# Patient Record
Sex: Male | Born: 1960
Health system: Southern US, Community
[De-identification: ages and names within clinical notes are randomized; demographics above are authoritative.]

## PROBLEM LIST (undated history)

## (undated) DIAGNOSIS — E119 Type 2 diabetes mellitus without complications: Secondary | ICD-10-CM

## (undated) DIAGNOSIS — I10 Essential (primary) hypertension: Secondary | ICD-10-CM

## (undated) DIAGNOSIS — E785 Hyperlipidemia, unspecified: Secondary | ICD-10-CM

## (undated) HISTORY — PX: NO PAST SURGERIES: SHX2092

## (undated) HISTORY — PX: COLONOSCOPY: SHX174

## (undated) HISTORY — DX: Hyperlipidemia, unspecified: E78.5

## (undated) HISTORY — DX: Essential (primary) hypertension: I10

---

## 2010-01-01 ENCOUNTER — Emergency Department (HOSPITAL_COMMUNITY): Admission: EM | Admit: 2010-01-01 | Discharge: 2010-01-01 | Payer: Self-pay | Admitting: Emergency Medicine

## 2013-07-30 ENCOUNTER — Ambulatory Visit: Payer: Self-pay | Admitting: Physician Assistant

## 2013-09-01 ENCOUNTER — Ambulatory Visit (INDEPENDENT_AMBULATORY_CARE_PROVIDER_SITE_OTHER): Payer: 59 | Admitting: Physician Assistant

## 2013-09-01 ENCOUNTER — Encounter: Payer: Self-pay | Admitting: Physician Assistant

## 2013-09-01 VITALS — BP 162/102 | HR 86 | Temp 97.6°F | Ht 72.0 in | Wt 279.8 lb

## 2013-09-01 DIAGNOSIS — L309 Dermatitis, unspecified: Secondary | ICD-10-CM | POA: Insufficient documentation

## 2013-09-01 DIAGNOSIS — Z1211 Encounter for screening for malignant neoplasm of colon: Secondary | ICD-10-CM

## 2013-09-01 DIAGNOSIS — L259 Unspecified contact dermatitis, unspecified cause: Secondary | ICD-10-CM

## 2013-09-01 DIAGNOSIS — I10 Essential (primary) hypertension: Secondary | ICD-10-CM

## 2013-09-01 DIAGNOSIS — R9431 Abnormal electrocardiogram [ECG] [EKG]: Secondary | ICD-10-CM

## 2013-09-01 DIAGNOSIS — Z0000 Encounter for general adult medical examination without abnormal findings: Secondary | ICD-10-CM

## 2013-09-01 MED ORDER — AMLODIPINE BESYLATE 10 MG PO TABS: 10.0000 mg | ORAL_TABLET | Freq: Every day | ORAL | Status: DC

## 2013-09-01 MED ORDER — HYDROCHLOROTHIAZIDE 12.5 MG PO CAPS: 12.5000 mg | ORAL_CAPSULE | Freq: Every day | ORAL | Status: DC

## 2013-09-01 MED ORDER — TRIAMCINOLONE ACETONIDE 0.1 % EX CREA: 1.0000 "application " | TOPICAL_CREAM | Freq: Two times a day (BID) | CUTANEOUS | Status: DC

## 2013-09-01 NOTE — Progress Notes (Signed)
   Patient ID: Dale Bishop is a 53 y.o. male DOB: [phone] MRN: [id number]     HPI:  Patient is a 53 year old male who presents to the office to establish care. Patient relocated to this area four years ago, has not been seen by a PCP since. Reports he has had HTN for nearly ten years without medications in the last five years. Took himself off of his medications. Was seen by cardiologist in past for an angioplasty exact year unknown. Has not seen cardiologist in over six years.  Also reports history of TIA's, states knows when they happen, only last for seconds, has not happened in some time. Has occasional outbreaks of eczema.  Reports no other chronic medical conditions. Denies chest pain/palpitations, SOB, cough, N/V/F, change in bowel/bladder habits, visual change/disturbances, headaches, lightheaded, dizzy or weakness. Denies nocturia, hematuria, difficulty urinating, pain/difficultly swallowing.   Influenza: 10/14 Tetanus: 2014 Eye Dr. 2/15 Dentist is scheduling appointment Colonoscopy: never  ROS: As stated in HPI. All other systems negative  Past Medical History  Diagnosis Date  . Hypertension    History reviewed. No pertinent family history. History   Social History  . Marital Status: Legally Separated    Spouse Name: N/A    Number of Children: N/A  . Years of Education: N/A   Social History Main Topics  . Smoking status: Never Smoker   . Smokeless tobacco: None  . Alcohol Use: No  . Drug Use: No  . Sexual Activity: None   Other Topics Concern  . None   Social History Narrative  . None   History reviewed. No pertinent past surgical history. No current outpatient prescriptions on file prior to visit.   No current facility-administered medications on file prior to visit.   Allergies  Allergen Reactions  . Penicillins     PE:  Filed Vitals:   09/01/13 0908  BP: 162/102  Pulse: 86  Temp: 97.6 F (36.4 C)    CONSTITUTIONAL: Well developed, well  nourished, pleasant, appears stated age, in NAD HEENT: normocephalic, atraumatic, bilateral ext/int canals normal. Bilateral TM's without injections, bulging, erythema. Nose normal, uvula midline, oropharynx clear and moist. EYES: PERRLA, bilateral EOM and conjunctiva normal NECK: FROM, supple, without thyromegaly or mass, no carotid bruits noted. CARDIO: RRR, normal S1 and S2, distal pulses intact, no extremity edema PULM/CHEST CTA bilateral, no wheezes, rales or rhonchi. Non tender. ABD: appearance normal, soft, nontender. Normal bowel sounds x 4 quadrants, non palpable liver, kidney, spleen. GU: deferred.  MUSC: FROM U/LE bilateral, FROM thoracic and lumbar spine, no midline tenderness noted.  LYMPH: no cervical, supraclavicular adenopathy NEURO: alert and oriented x 3, no cranial nerve deficit, motor strength and coordination NL.  DTR's intact, Negative romberg. Gait normal. SKIN: warm, dry, no lesions noted. eczematic rash to flexor surfaces of bilateral UE PSYCH: Mood and affect normal, speech normal.  ECG today, sinus rhythm, left atrial enlargement. Rate 81 bpm.   ASSESSMENT and PLAN   CPX/v70.0 - Patient has been counseled on age-appropriate routine health concerns for screening and prevention. These are reviewed and up-to-date. Immunizations are up-to-date or declined. Labs ordered and ECG reviewed.  HM Referral to GI for colonscopy  HTN: Rx for  Amlodipine 10 mg once daily HCTZ 12.5 mg once daily ECG today - abnormal, reading above Order for echo today RTO in one month for evaluation  Eczema Rx for Triamcinolone cream

## 2013-09-01 NOTE — Assessment & Plan Note (Signed)
Amlodipine 10 mg once daily HCTZ 12.5 mg once daily ECG today - abnormal, reading above Order for echo today RTO in one month for evaluation

## 2013-09-01 NOTE — Progress Notes (Signed)
Pre visit review using our clinic review tool, if applicable. No additional management support is needed unless otherwise documented below in the visit note. 

## 2013-09-01 NOTE — Patient Instructions (Signed)
It was great meeting you today Dale Bishop!   Labs have been ordered for you, when you report to lab please be fasting.    Hypertension Hypertension is another name for high blood pressure. High blood pressure may mean that your heart needs to work harder to pump blood. Blood pressure consists of two numbers, which includes a higher number over a lower number (example: 110/72). HOME CARE   Make lifestyle changes as told by your doctor. This may include weight loss and exercise.  Take your blood pressure medicine every day.  Limit how much salt you use.  Stop smoking if you smoke.  Do not use drugs.  Talk to your doctor if you are using decongestants or birth control pills. These medicines might make blood pressure higher.  Females should not drink more than 1 alcoholic drink per day. Males should not drink more than 2 alcoholic drinks per day.  See your doctor as told. GET HELP RIGHT AWAY IF:   You have a blood pressure reading with a top number of 180 or higher.  You get a very bad headache.  You get blurred or changing vision.  You feel confused.  You feel weak, numb, or faint.  You get chest or belly (abdominal) pain.  You throw up (vomit).  You cannot breathe very well. MAKE SURE YOU:   Understand these instructions.  Will watch your condition.  Will get help right away if you are not doing well or get worse. Document Released: 11/20/2007 Document Revised: 08/26/2011 Document Reviewed: 11/20/2007 [facility] Patient Information 2014 [place], [place].   DASH Diet The DASH diet stands for "Dietary Approaches to Stop Hypertension." It is a healthy eating plan that has been shown to reduce high blood pressure (hypertension) in as little as 14 days, while also possibly providing other significant health benefits. These other health benefits include reducing the risk of breast cancer after menopause and reducing the risk of type 2 diabetes, heart disease, colon  cancer, and stroke. Health benefits also include weight loss and slowing kidney failure in patients with chronic kidney disease.  DIET GUIDELINES  Limit salt (sodium). Your diet should contain less than 1500 mg of sodium daily.  Limit refined or processed carbohydrates. Your diet should include mostly whole grains. Desserts and added sugars should be used sparingly.  Include small amounts of heart-healthy fats. These types of fats include nuts, oils, and tub margarine. Limit saturated and trans fats. These fats have been shown to be harmful in the body. CHOOSING FOODS  The following food groups are based on a 2000 calorie diet. See your Registered Dietitian for individual calorie needs. Grains and Grain Products (6 to 8 servings daily)  Eat More Often: Whole-wheat bread, brown rice, whole-grain or wheat pasta, quinoa, popcorn without added fat or salt (air popped).  Eat Less Often: White bread, white pasta, white rice, cornbread. Vegetables (4 to 5 servings daily)  Eat More Often: Fresh, frozen, and canned vegetables. Vegetables may be raw, steamed, roasted, or grilled with a minimal amount of fat.  Eat Less Often/Avoid: Creamed or fried vegetables. Vegetables in a cheese sauce. Fruit (4 to 5 servings daily)  Eat More Often: All fresh, canned (in natural juice), or frozen fruits. Dried fruits without added sugar. One hundred percent fruit juice ( cup [237 mL] daily).  Eat Less Often: Dried fruits with added sugar. Canned fruit in light or heavy syrup. [employer], Fish, and Poultry (2 servings or less daily. One serving is 3  to 4 oz [85-114 g]).  Eat More Often: Ninety percent or leaner ground beef, tenderloin, sirloin. Round cuts of beef, chicken breast, [place] breast. All fish. Grill, bake, or broil your meat. Nothing should be fried.  Eat Less Often/Avoid: Fatty cuts of meat, [place], or chicken leg, thigh, or wing. Fried cuts of meat or fish. Dairy (2 to 3 servings)  Eat More  Often: Low-fat or fat-free milk, low-fat plain or light yogurt, reduced-fat or part-skim cheese.  Eat Less Often/Avoid: Milk (whole, 2%).Whole milk yogurt. Full-fat cheeses. Nuts, Seeds, and Legumes (4 to 5 servings per week)  Eat More Often: All without added salt.  Eat Less Often/Avoid: Salted nuts and seeds, canned beans with added salt. Fats and Sweets (limited)  Eat More Often: Vegetable oils, tub margarines without trans fats, sugar-free gelatin. Mayonnaise and salad dressings.  Eat Less Often/Avoid: Coconut oils, palm oils, butter, stick margarine, cream, half and half, cookies, candy, pie. FOR MORE INFORMATION The Dash Diet Eating Plan: www.dashdiet.org Document Released: 05/23/2011 Document Revised: 08/26/2011 Document Reviewed: 05/23/2011 [facility] Patient Information 2014 [place], [place].

## 2013-09-07 ENCOUNTER — Other Ambulatory Visit (INDEPENDENT_AMBULATORY_CARE_PROVIDER_SITE_OTHER): Payer: 59

## 2013-09-07 ENCOUNTER — Encounter: Payer: Self-pay | Admitting: Gastroenterology

## 2013-09-07 DIAGNOSIS — R9431 Abnormal electrocardiogram [ECG] [EKG]: Secondary | ICD-10-CM

## 2013-09-07 DIAGNOSIS — Z0000 Encounter for general adult medical examination without abnormal findings: Secondary | ICD-10-CM

## 2013-09-07 DIAGNOSIS — I10 Essential (primary) hypertension: Secondary | ICD-10-CM

## 2013-09-07 DIAGNOSIS — Z1211 Encounter for screening for malignant neoplasm of colon: Secondary | ICD-10-CM

## 2013-09-07 LAB — CBC WITH DIFFERENTIAL/PLATELET
Basophils Absolute: 0 10*3/uL (ref 0.0–0.1)
Basophils Relative: 0.5 % (ref 0.0–3.0)
EOS ABS: 0.2 10*3/uL (ref 0.0–0.7)
EOS PCT: 2.5 % (ref 0.0–5.0)
HCT: 46.3 % (ref 39.0–52.0)
HEMOGLOBIN: 15.2 g/dL (ref 13.0–17.0)
LYMPHS PCT: 31.5 % (ref 12.0–46.0)
Lymphs Abs: 2.4 10*3/uL (ref 0.7–4.0)
MCHC: 32.9 g/dL (ref 30.0–36.0)
MCV: 82.5 fl (ref 78.0–100.0)
Monocytes Absolute: 0.8 10*3/uL (ref 0.1–1.0)
Monocytes Relative: 10.7 % (ref 3.0–12.0)
NEUTROS PCT: 54.8 % (ref 43.0–77.0)
Neutro Abs: 4.2 10*3/uL (ref 1.4–7.7)
Platelets: 297 10*3/uL (ref 150.0–400.0)
RBC: 5.61 Mil/uL (ref 4.22–5.81)
RDW: 14.9 % — ABNORMAL HIGH (ref 11.5–14.6)
WBC: 7.7 10*3/uL (ref 4.5–10.5)

## 2013-09-07 LAB — URINALYSIS, ROUTINE W REFLEX MICROSCOPIC
Bilirubin Urine: NEGATIVE
Ketones, ur: NEGATIVE
LEUKOCYTES UA: NEGATIVE
Nitrite: NEGATIVE
PH: 6 (ref 5.0–8.0)
Specific Gravity, Urine: 1.02 (ref 1.000–1.030)
Total Protein, Urine: NEGATIVE
URINE GLUCOSE: NEGATIVE
UROBILINOGEN UA: 0.2 (ref 0.0–1.0)

## 2013-09-07 LAB — BASIC METABOLIC PANEL
BUN: 17 mg/dL (ref 6–23)
CHLORIDE: 101 meq/L (ref 96–112)
CO2: 27 meq/L (ref 19–32)
Calcium: 9.9 mg/dL (ref 8.4–10.5)
Creatinine, Ser: 1.4 mg/dL (ref 0.4–1.5)
GFR: 70.31 mL/min (ref >60.00)
GLUCOSE: 125 mg/dL — AB (ref 70–99)
Potassium: 3.6 mEq/L (ref 3.5–5.1)
SODIUM: 136 meq/L (ref 135–145)

## 2013-09-07 LAB — LIPID PANEL
CHOL/HDL RATIO: 6
Cholesterol: 267 mg/dL — ABNORMAL HIGH (ref 0–200)
HDL: 48.4 mg/dL (ref >39.00)
LDL Cholesterol: 185 mg/dL — ABNORMAL HIGH (ref 0–99)
Triglycerides: 167 mg/dL — ABNORMAL HIGH (ref 0.0–149.0)
VLDL: 33.4 mg/dL (ref 0.0–40.0)

## 2013-09-07 LAB — TSH: TSH: 2.71 u[IU]/mL (ref 0.35–5.50)

## 2013-09-07 LAB — HEPATIC FUNCTION PANEL
ALT: 22 U/L (ref 0–53)
AST: 18 U/L (ref 0–37)
Albumin: 4.4 g/dL (ref 3.5–5.2)
Alkaline Phosphatase: 50 U/L (ref 39–117)
BILIRUBIN TOTAL: 0.7 mg/dL (ref 0.3–1.2)
Bilirubin, Direct: 0.1 mg/dL (ref 0.0–0.3)
Total Protein: 8 g/dL (ref 6.0–8.3)

## 2013-09-08 ENCOUNTER — Other Ambulatory Visit: Payer: Self-pay | Admitting: Physician Assistant

## 2013-09-08 DIAGNOSIS — E785 Hyperlipidemia, unspecified: Secondary | ICD-10-CM

## 2013-09-08 MED ORDER — ATORVASTATIN CALCIUM 40 MG PO TABS: 40.0000 mg | ORAL_TABLET | Freq: Every day | ORAL | Status: DC

## 2013-09-16 ENCOUNTER — Other Ambulatory Visit (HOSPITAL_COMMUNITY): Payer: 59

## 2013-09-28 ENCOUNTER — Other Ambulatory Visit (HOSPITAL_COMMUNITY): Payer: 59

## 2013-09-30 ENCOUNTER — Ambulatory Visit: Payer: 59 | Admitting: Physician Assistant

## 2013-10-20 ENCOUNTER — Encounter: Payer: 59 | Admitting: Gastroenterology

## 2013-10-22 ENCOUNTER — Ambulatory Visit (INDEPENDENT_AMBULATORY_CARE_PROVIDER_SITE_OTHER): Payer: 59 | Admitting: Internal Medicine

## 2013-10-22 ENCOUNTER — Encounter: Payer: Self-pay | Admitting: Internal Medicine

## 2013-10-22 VITALS — BP 150/90 | HR 76 | Temp 97.1°F | Resp 16 | Wt 272.0 lb

## 2013-10-22 DIAGNOSIS — E785 Hyperlipidemia, unspecified: Secondary | ICD-10-CM

## 2013-10-22 DIAGNOSIS — L309 Dermatitis, unspecified: Secondary | ICD-10-CM

## 2013-10-22 DIAGNOSIS — L259 Unspecified contact dermatitis, unspecified cause: Secondary | ICD-10-CM

## 2013-10-22 DIAGNOSIS — I10 Essential (primary) hypertension: Secondary | ICD-10-CM

## 2013-10-22 MED ORDER — VITAMIN D 1000 UNITS PO TABS: 1000.0000 [IU] | ORAL_TABLET | Freq: Every day | ORAL | Status: AC

## 2013-10-22 MED ORDER — HYDROCHLOROTHIAZIDE 25 MG PO TABS: 25.0000 mg | ORAL_TABLET | Freq: Every day | ORAL | Status: DC

## 2013-10-22 NOTE — Patient Instructions (Signed)
Gluten free trial (no wheat products) for 4-6 weeks. OK to use gluten-free bread and gluten-free pasta.  Milk free trial (no milk, ice cream, cheese and yogurt) for 4-6 weeks. OK to use almond, coconut, rice milk. "Almond breeze" brand tastes good.  

## 2013-10-22 NOTE — Progress Notes (Signed)
Pre visit review using our clinic review tool, if applicable. No additional management support is needed unless otherwise documented below in the visit note. 

## 2013-10-22 NOTE — Progress Notes (Signed)
   Subjective:     HPI   F/u HTN, eczema, dyslipidemia  BP Readings from Last 3 Encounters:  10/22/13 150/90  09/01/13 162/102   Wt Readings from Last 3 Encounters:  10/22/13 272 lb (123.378 kg)  09/01/13 279 lb 12.8 oz (126.916 kg)       Review of Systems  Constitutional: Negative for appetite change, fatigue and unexpected weight change.  HENT: Negative for congestion, nosebleeds, sneezing, sore throat and trouble swallowing.   Eyes: Negative for itching and visual disturbance.  Respiratory: Negative for cough.   Cardiovascular: Negative for chest pain, palpitations and leg swelling.  Gastrointestinal: Negative for nausea, diarrhea, blood in stool and abdominal distention.  Genitourinary: Negative for frequency and hematuria.  Musculoskeletal: Negative for back pain, gait problem, joint swelling and neck pain.  Skin: Positive for rash.  Neurological: Negative for dizziness, tremors, speech difficulty and weakness.  Psychiatric/Behavioral: Negative for sleep disturbance, dysphoric mood and agitation. The patient is not nervous/anxious.        Objective:   Physical Exam  Constitutional: He is oriented to person, place, and time. He appears well-developed. No distress.  NAD  HENT:  Mouth/Throat: Oropharynx is clear and moist.  Eyes: Conjunctivae are normal. Pupils are equal, round, and reactive to light.  Neck: Normal range of motion. No JVD present. No thyromegaly present.  Cardiovascular: Normal rate, regular rhythm, normal heart sounds and intact distal pulses.  Exam reveals no gallop and no friction rub.   No murmur heard. Pulmonary/Chest: Effort normal and breath sounds normal. No respiratory distress. He has no wheezes. He has no rales. He exhibits no tenderness.  Abdominal: Soft. Bowel sounds are normal. He exhibits no distension and no mass. There is no tenderness. There is no rebound and no guarding.  Musculoskeletal: Normal range of motion. He exhibits no  edema and no tenderness.  Lymphadenopathy:    He has no cervical adenopathy.  Neurological: He is alert and oriented to person, place, and time. He has normal reflexes. No cranial nerve deficit. He exhibits normal muscle tone. He displays a negative Romberg sign. Coordination and gait normal.  No meningeal signs  Skin: Skin is warm and dry. No rash noted.  Psychiatric: He has a normal mood and affect. His behavior is normal. Judgment and thought content normal.    Lab Results  Component Value Date   WBC 7.7 09/07/2013   HGB 15.2 09/07/2013   HCT 46.3 09/07/2013   PLT 297.0 09/07/2013   GLUCOSE 125* 09/07/2013   CHOL 267* 09/07/2013   TRIG 167.0* 09/07/2013   HDL 48.40 09/07/2013   LDLCALC 185* 09/07/2013   ALT 22 09/07/2013   AST 18 09/07/2013   NA 136 09/07/2013   K 3.6 09/07/2013   CL 101 09/07/2013   CREATININE 1.4 09/07/2013   BUN 17 09/07/2013   CO2 27 09/07/2013   TSH 2.71 09/07/2013         Assessment & Plan:

## 2013-10-22 NOTE — Assessment & Plan Note (Signed)
On Lipitor 

## 2013-10-24 NOTE — Assessment & Plan Note (Addendum)
Will watch BP BP Readings from Last 3 Encounters:  10/22/13 150/90  09/01/13 162/102

## 2013-10-24 NOTE — Assessment & Plan Note (Signed)
Diet restriction Claritin Steroid cream

## 2014-02-23 ENCOUNTER — Ambulatory Visit: Payer: 59 | Admitting: Internal Medicine

## 2014-02-24 ENCOUNTER — Ambulatory Visit: Payer: 59 | Admitting: Internal Medicine

## 2014-02-24 ENCOUNTER — Telehealth: Payer: Self-pay | Admitting: *Deleted

## 2014-02-24 DIAGNOSIS — I10 Essential (primary) hypertension: Secondary | ICD-10-CM

## 2014-02-24 DIAGNOSIS — Z0000 Encounter for general adult medical examination without abnormal findings: Secondary | ICD-10-CM

## 2014-02-24 DIAGNOSIS — R9431 Abnormal electrocardiogram [ECG] [EKG]: Secondary | ICD-10-CM

## 2014-02-24 DIAGNOSIS — Z1211 Encounter for screening for malignant neoplasm of colon: Secondary | ICD-10-CM

## 2014-02-24 MED ORDER — AMLODIPINE BESYLATE 10 MG PO TABS: 10.0000 mg | ORAL_TABLET | Freq: Every day | ORAL | Status: DC

## 2014-02-24 NOTE — Telephone Encounter (Signed)
Pt states he is out of his amlodipine. Have appt to see md 02/28/14 for f/u he maybe changing med requesting a 5 day supply until he see md. Inform pt will send...Johny Chess

## 2014-02-28 ENCOUNTER — Encounter: Payer: Self-pay | Admitting: Internal Medicine

## 2014-02-28 ENCOUNTER — Ambulatory Visit (INDEPENDENT_AMBULATORY_CARE_PROVIDER_SITE_OTHER): Payer: 59 | Admitting: Internal Medicine

## 2014-02-28 ENCOUNTER — Other Ambulatory Visit: Payer: Self-pay | Admitting: Internal Medicine

## 2014-02-28 VITALS — BP 140/90 | HR 70 | Temp 97.7°F | Wt 265.0 lb

## 2014-02-28 DIAGNOSIS — L309 Dermatitis, unspecified: Secondary | ICD-10-CM

## 2014-02-28 DIAGNOSIS — I10 Essential (primary) hypertension: Secondary | ICD-10-CM

## 2014-02-28 DIAGNOSIS — L259 Unspecified contact dermatitis, unspecified cause: Secondary | ICD-10-CM

## 2014-02-28 DIAGNOSIS — D485 Neoplasm of uncertain behavior of skin: Secondary | ICD-10-CM | POA: Insufficient documentation

## 2014-02-28 DIAGNOSIS — E785 Hyperlipidemia, unspecified: Secondary | ICD-10-CM

## 2014-02-28 MED ORDER — TRIAMTERENE-HCTZ 37.5-25 MG PO TABS: 1.0000 | ORAL_TABLET | Freq: Every day | ORAL | Status: DC

## 2014-02-28 NOTE — Assessment & Plan Note (Signed)
9/5 L inner thigh Skin bx

## 2014-02-28 NOTE — Progress Notes (Signed)
Pre visit review using our clinic review tool, if applicable. No additional management support is needed unless otherwise documented below in the visit note. 

## 2014-02-28 NOTE — Patient Instructions (Signed)
Gluten free trial (no wheat products) for 4-6 weeks. OK to use gluten-free bread and gluten-free pasta.  Milk free trial (no milk, ice cream, cheese and yogurt) for 4-6 weeks. OK to use almond, coconut, milk. "Almond breeze" brand tastes good.

## 2014-02-28 NOTE — Progress Notes (Signed)
Patient ID: Dale Bishop, male   DOB: [birth year], 53 y.o.   MRN: [id number]   Subjective:     HPI   F/u HTN, eczema, dyslipidemia  BP Readings from Last 3 Encounters:  02/28/14 140/90  10/22/13 150/90  09/01/13 162/102   Wt Readings from Last 3 Encounters:  02/28/14 265 lb (120.203 kg)  10/22/13 272 lb (123.378 kg)  09/01/13 279 lb 12.8 oz (126.916 kg)       Review of Systems  Constitutional: Negative for appetite change, fatigue and unexpected weight change.  HENT: Negative for congestion, nosebleeds, sneezing, sore throat and trouble swallowing.   Eyes: Negative for itching and visual disturbance.  Respiratory: Negative for cough.   Cardiovascular: Negative for chest pain, palpitations and leg swelling.  Gastrointestinal: Negative for nausea, diarrhea, blood in stool and abdominal distention.  Genitourinary: Negative for frequency and hematuria.  Musculoskeletal: Negative for back pain, gait problem, joint swelling and neck pain.  Skin: Positive for rash.  Neurological: Negative for dizziness, tremors, speech difficulty and weakness.  Psychiatric/Behavioral: Negative for sleep disturbance, dysphoric mood and agitation. The patient is not nervous/anxious.        Objective:   Physical Exam  Constitutional: He is oriented to person, place, and time. He appears well-developed. No distress.  NAD  HENT:  Mouth/Throat: Oropharynx is clear and moist.  Eyes: Conjunctivae are normal. Pupils are equal, round, and reactive to light.  Neck: Normal range of motion. No JVD present. No thyromegaly present.  Cardiovascular: Normal rate, regular rhythm, normal heart sounds and intact distal pulses.  Exam reveals no gallop and no friction rub.   No murmur heard. Pulmonary/Chest: Effort normal and breath sounds normal. No respiratory distress. He has no wheezes. He has no rales. He exhibits no tenderness.  Abdominal: Soft. Bowel sounds are normal. He exhibits no distension and no  mass. There is no tenderness. There is no rebound and no guarding.  Musculoskeletal: Normal range of motion. He exhibits no edema and no tenderness.  Lymphadenopathy:    He has no cervical adenopathy.  Neurological: He is alert and oriented to person, place, and time. He has normal reflexes. No cranial nerve deficit. He exhibits normal muscle tone. He displays a negative Romberg sign. Coordination and gait normal.  No meningeal signs  Skin: Skin is warm and dry. No rash noted.  Psychiatric: He has a normal mood and affect. His behavior is normal. Judgment and thought content normal.  L inner thigh growth  Lab Results  Component Value Date   WBC 7.7 09/07/2013   HGB 15.2 09/07/2013   HCT 46.3 09/07/2013   PLT 297.0 09/07/2013   GLUCOSE 125* 09/07/2013   CHOL 267* 09/07/2013   TRIG 167.0* 09/07/2013   HDL 48.40 09/07/2013   LDLCALC 185* 09/07/2013   ALT 22 09/07/2013   AST 18 09/07/2013   NA 136 09/07/2013   K 3.6 09/07/2013   CL 101 09/07/2013   CREATININE 1.4 09/07/2013   BUN 17 09/07/2013   CO2 27 09/07/2013   TSH 2.71 09/07/2013         Assessment & Plan:

## 2014-03-03 ENCOUNTER — Ambulatory Visit: Payer: 59 | Admitting: Internal Medicine

## 2014-03-03 ENCOUNTER — Encounter: Payer: Self-pay | Admitting: Internal Medicine

## 2014-03-03 ENCOUNTER — Telehealth: Payer: Self-pay | Admitting: *Deleted

## 2014-03-03 DIAGNOSIS — Z1211 Encounter for screening for malignant neoplasm of colon: Secondary | ICD-10-CM

## 2014-03-03 DIAGNOSIS — I10 Essential (primary) hypertension: Secondary | ICD-10-CM

## 2014-03-03 DIAGNOSIS — R9431 Abnormal electrocardiogram [ECG] [EKG]: Secondary | ICD-10-CM

## 2014-03-03 DIAGNOSIS — Z0000 Encounter for general adult medical examination without abnormal findings: Secondary | ICD-10-CM

## 2014-03-03 MED ORDER — AMLODIPINE BESYLATE 10 MG PO TABS: 10.0000 mg | ORAL_TABLET | Freq: Every day | ORAL | Status: DC

## 2014-03-03 NOTE — Assessment & Plan Note (Signed)
Continue with current prescription therapy as reflected on the Med list.  

## 2014-03-03 NOTE — Telephone Encounter (Signed)
Left msg on triage needing his norvasc sent to [place]. He is out of med.../lmb   Notified pt will send to [place]...Johny Chess

## 2014-03-03 NOTE — Assessment & Plan Note (Signed)
Better Continue with current prescription therapy as reflected on the Med list.  

## 2014-03-09 ENCOUNTER — Ambulatory Visit: Payer: 59 | Admitting: Internal Medicine

## 2014-03-29 ENCOUNTER — Ambulatory Visit: Payer: 59 | Admitting: Internal Medicine

## 2014-04-01 ENCOUNTER — Ambulatory Visit: Payer: 59 | Admitting: Internal Medicine

## 2014-04-19 ENCOUNTER — Ambulatory Visit (INDEPENDENT_AMBULATORY_CARE_PROVIDER_SITE_OTHER): Payer: 59 | Admitting: Internal Medicine

## 2014-04-19 ENCOUNTER — Encounter: Payer: Self-pay | Admitting: Internal Medicine

## 2014-04-19 VITALS — BP 152/100 | HR 84 | Temp 97.8°F | Wt 267.0 lb

## 2014-04-19 DIAGNOSIS — D489 Neoplasm of uncertain behavior, unspecified: Secondary | ICD-10-CM

## 2014-04-19 DIAGNOSIS — R110 Nausea: Secondary | ICD-10-CM

## 2014-04-19 DIAGNOSIS — J309 Allergic rhinitis, unspecified: Secondary | ICD-10-CM

## 2014-04-19 DIAGNOSIS — I10 Essential (primary) hypertension: Secondary | ICD-10-CM

## 2014-04-19 DIAGNOSIS — D485 Neoplasm of uncertain behavior of skin: Secondary | ICD-10-CM

## 2014-04-19 MED ORDER — CARVEDILOL 12.5 MG PO TABS: 12.5000 mg | ORAL_TABLET | Freq: Two times a day (BID) | ORAL | Status: DC

## 2014-04-19 MED ORDER — FLUTICASONE PROPIONATE 50 MCG/ACT NA SUSP: 2.0000 | Freq: Every day | NASAL | Status: DC

## 2014-04-19 MED ORDER — LORATADINE 10 MG PO TABS: 10.0000 mg | ORAL_TABLET | Freq: Every day | ORAL | Status: DC

## 2014-04-19 NOTE — Patient Instructions (Signed)
Postprocedure instructions :    A Band-Aid should be  changed twice daily. You can take a shower tomorrow.  Keep the wounds clean. You can wash them with liquid soap and water. Pat dry with gauze or a Kleenex tissue  Before applying antibiotic ointment and a Band-Aid.   You need to report immediately  if fever, chills or any signs of infection develop.    The biopsy results should be available in 1 -2 weeks. 

## 2014-04-19 NOTE — Progress Notes (Signed)
Pre visit review using our clinic review tool, if applicable. No additional management support is needed unless otherwise documented below in the visit note. 

## 2014-04-19 NOTE — Progress Notes (Signed)
Subjective:     HPI  He is here for skin bx C/o nausea w/HCTZ C/o allergies F/u HTN, eczema, dyslipidemia  BP Readings from Last 3 Encounters:  04/19/14 152/100  02/28/14 140/90  10/22/13 150/90   Wt Readings from Last 3 Encounters:  04/19/14 267 lb (121.11 kg)  02/28/14 265 lb (120.203 kg)  10/22/13 272 lb (123.378 kg)       Review of Systems  Constitutional: Negative for appetite change, fatigue and unexpected weight change.  HENT: Negative for congestion, nosebleeds, sneezing, sore throat and trouble swallowing.   Eyes: Negative for itching and visual disturbance.  Respiratory: Negative for cough.   Cardiovascular: Negative for chest pain, palpitations and leg swelling.  Gastrointestinal: Negative for nausea, diarrhea, blood in stool and abdominal distention.  Genitourinary: Negative for frequency and hematuria.  Musculoskeletal: Negative for back pain, joint swelling, gait problem and neck pain.  Skin: Positive for rash.  Neurological: Negative for dizziness, tremors, speech difficulty and weakness.  Psychiatric/Behavioral: Negative for sleep disturbance, dysphoric mood and agitation. The patient is not nervous/anxious.        Objective:   Physical Exam  Constitutional: He is oriented to person, place, and time. He appears well-developed. No distress.  NAD  HENT:  Mouth/Throat: Oropharynx is clear and moist.  Eyes: Conjunctivae are normal. Pupils are equal, round, and reactive to light.  Neck: Normal range of motion. No JVD present. No thyromegaly present.  Cardiovascular: Normal rate, regular rhythm, normal heart sounds and intact distal pulses.  Exam reveals no gallop and no friction rub.   No murmur heard. Pulmonary/Chest: Effort normal and breath sounds normal. No respiratory distress. He has no wheezes. He has no rales. He exhibits no tenderness.  Abdominal: Soft. Bowel sounds are normal. He exhibits no distension and no mass. There is no tenderness.  There is no rebound and no guarding.  Musculoskeletal: Normal range of motion. He exhibits no edema or tenderness.  Lymphadenopathy:    He has no cervical adenopathy.  Neurological: He is alert and oriented to person, place, and time. He has normal reflexes. No cranial nerve deficit. He exhibits normal muscle tone. He displays a negative Romberg sign. Coordination and gait normal.  No meningeal signs  Skin: Skin is warm and dry. No rash noted.  Psychiatric: He has a normal mood and affect. His behavior is normal. Judgment and thought content normal.  L inner thigh growth  Lab Results  Component Value Date   WBC 7.7 09/07/2013   HGB 15.2 09/07/2013   HCT 46.3 09/07/2013   PLT 297.0 09/07/2013   GLUCOSE 125* 09/07/2013   CHOL 267* 09/07/2013   TRIG 167.0* 09/07/2013   HDL 48.40 09/07/2013   LDLCALC 185* 09/07/2013   ALT 22 09/07/2013   AST 18 09/07/2013   NA 136 09/07/2013   K 3.6 09/07/2013   CL 101 09/07/2013   CREATININE 1.4 09/07/2013   BUN 17 09/07/2013   CO2 27 09/07/2013   TSH 2.71 09/07/2013     Procedure Note :     Procedure :  Skin biopsy   Indication: Suspicious lesion(s)   Risks including unsuccessful procedure , bleeding, infection, bruising, scar, a need for another complete procedure and others were explained to the patient in detail as well as the benefits. Informed consent was obtained and signed.   The patient was placed in a decubitus position.  Lesion #1 on L thigh    measuring  6x6   mm   Skin over  lesion #1  was prepped with Betadine and alcohol  and anesthetized with 1 cc of 2% lidocaine and epinephrine, using a 25-gauge 1 inch needle.  Shave biopsy with a sterile Dermablade was carried out in the usual fashion. Hyfrecator was used to destroy the rest of the lesion potentially left behind and for hemostasis. Band-Aid was applied with antibiotic ointment.       Assessment & Plan:

## 2014-04-21 DIAGNOSIS — R110 Nausea: Secondary | ICD-10-CM | POA: Insufficient documentation

## 2014-04-21 DIAGNOSIS — J309 Allergic rhinitis, unspecified: Secondary | ICD-10-CM | POA: Insufficient documentation

## 2014-04-21 NOTE — Assessment & Plan Note (Signed)
See med change

## 2014-04-21 NOTE — Assessment & Plan Note (Signed)
2015 due to maxzide Will d/c

## 2014-04-21 NOTE — Assessment & Plan Note (Signed)
Skin bx 

## 2014-04-21 NOTE — Assessment & Plan Note (Signed)
- 

## 2014-08-01 ENCOUNTER — Encounter: Payer: Self-pay | Admitting: Internal Medicine

## 2014-08-01 ENCOUNTER — Ambulatory Visit (INDEPENDENT_AMBULATORY_CARE_PROVIDER_SITE_OTHER): Payer: 59 | Admitting: Internal Medicine

## 2014-08-01 ENCOUNTER — Telehealth: Payer: Self-pay | Admitting: Internal Medicine

## 2014-08-01 VITALS — BP 140/98 | HR 82 | Temp 97.7°F | Ht 71.5 in | Wt 277.0 lb

## 2014-08-01 DIAGNOSIS — R9431 Abnormal electrocardiogram [ECG] [EKG]: Secondary | ICD-10-CM

## 2014-08-01 DIAGNOSIS — J3089 Other allergic rhinitis: Secondary | ICD-10-CM

## 2014-08-01 DIAGNOSIS — Z0000 Encounter for general adult medical examination without abnormal findings: Secondary | ICD-10-CM

## 2014-08-01 DIAGNOSIS — E785 Hyperlipidemia, unspecified: Secondary | ICD-10-CM

## 2014-08-01 DIAGNOSIS — I10 Essential (primary) hypertension: Secondary | ICD-10-CM

## 2014-08-01 DIAGNOSIS — H6590 Unspecified nonsuppurative otitis media, unspecified ear: Secondary | ICD-10-CM

## 2014-08-01 DIAGNOSIS — Z1211 Encounter for screening for malignant neoplasm of colon: Secondary | ICD-10-CM

## 2014-08-01 MED ORDER — CARVEDILOL 12.5 MG PO TABS: 12.5000 mg | ORAL_TABLET | Freq: Two times a day (BID) | ORAL | Status: DC

## 2014-08-01 MED ORDER — LEVOFLOXACIN 500 MG PO TABS: 500.0000 mg | ORAL_TABLET | Freq: Every day | ORAL | Status: DC

## 2014-08-01 MED ORDER — HYDROCHLOROTHIAZIDE 25 MG PO TABS: 25.0000 mg | ORAL_TABLET | Freq: Every day | ORAL | Status: DC

## 2014-08-01 MED ORDER — AMLODIPINE BESYLATE 10 MG PO TABS: 10.0000 mg | ORAL_TABLET | Freq: Every day | ORAL | Status: DC

## 2014-08-01 NOTE — Assessment & Plan Note (Signed)
Continue with current prescription HCTZ and Norvasc Re-start Coreg

## 2014-08-01 NOTE — Assessment & Plan Note (Signed)
2/16 L>R    Several weeks Empiric Levaquin

## 2014-08-01 NOTE — Assessment & Plan Note (Signed)
Flonase

## 2014-08-01 NOTE — Assessment & Plan Note (Addendum)
We discussed age appropriate health related issues, including available/recomended screening tests and vaccinations. We discussed a need for adhering to healthy diet and exercise. Labs/EKG were reviewed/ordered. All questions were answered.  Declined a rectal exam  Colonoscopy

## 2014-08-01 NOTE — Telephone Encounter (Signed)
emmi emailed °

## 2014-08-01 NOTE — Assessment & Plan Note (Signed)
Taking Lipitor qod

## 2014-08-01 NOTE — Progress Notes (Signed)
   Subjective:     HPI  The patient is here for a wellness exam. The patient has been doing well overall without major physical or psychological issues going on lately. C/o L ear congestion and fullness (Nov 2015)   F/u HTN, eczema, dyslipidemia  BP Readings from Last 3 Encounters:  08/01/14 140/98  04/19/14 152/100  02/28/14 140/90   Wt Readings from Last 3 Encounters:  08/01/14 277 lb (125.646 kg)  04/19/14 267 lb (121.11 kg)  02/28/14 265 lb (120.203 kg)       Review of Systems  Constitutional: Negative for appetite change, fatigue and unexpected weight change.  HENT: Negative for congestion, nosebleeds, sneezing, sore throat and trouble swallowing.   Eyes: Negative for itching and visual disturbance.  Respiratory: Negative for cough.   Cardiovascular: Negative for chest pain, palpitations and leg swelling.  Gastrointestinal: Negative for nausea, diarrhea, blood in stool and abdominal distention.  Genitourinary: Negative for frequency and hematuria.  Musculoskeletal: Negative for back pain, joint swelling, gait problem and neck pain.  Skin: Positive for rash.  Neurological: Negative for dizziness, tremors, speech difficulty and weakness.  Psychiatric/Behavioral: Negative for sleep disturbance, dysphoric mood and agitation. The patient is not nervous/anxious.        Objective:   Physical Exam  Constitutional: He is oriented to person, place, and time. He appears well-developed. No distress.  NAD  HENT:  Mouth/Throat: Oropharynx is clear and moist.  Eyes: Conjunctivae are normal. Pupils are equal, round, and reactive to light.  Neck: Normal range of motion. No JVD present. No thyromegaly present.  Cardiovascular: Normal rate, regular rhythm, normal heart sounds and intact distal pulses.  Exam reveals no gallop and no friction rub.   No murmur heard. Pulmonary/Chest: Effort normal and breath sounds normal. No respiratory distress. He has no wheezes. He has no rales.  He exhibits no tenderness.  Abdominal: Soft. Bowel sounds are normal. He exhibits no distension and no mass. There is no tenderness. There is no rebound and no guarding.  Musculoskeletal: Normal range of motion. He exhibits no edema or tenderness.  Lymphadenopathy:    He has no cervical adenopathy.  Neurological: He is alert and oriented to person, place, and time. He has normal reflexes. No cranial nerve deficit. He exhibits normal muscle tone. He displays a negative Romberg sign. Coordination and gait normal.  No meningeal signs  Skin: Skin is warm and dry. No rash noted.  Psychiatric: He has a normal mood and affect. His behavior is normal. Judgment and thought content normal.  Declined a rectal exam  Lab Results  Component Value Date   WBC 7.7 09/07/2013   HGB 15.2 09/07/2013   HCT 46.3 09/07/2013   PLT 297.0 09/07/2013   GLUCOSE 125* 09/07/2013   CHOL 267* 09/07/2013   TRIG 167.0* 09/07/2013   HDL 48.40 09/07/2013   LDLCALC 185* 09/07/2013   ALT 22 09/07/2013   AST 18 09/07/2013   NA 136 09/07/2013   K 3.6 09/07/2013   CL 101 09/07/2013   CREATININE 1.4 09/07/2013   BUN 17 09/07/2013   CO2 27 09/07/2013   TSH 2.71 09/07/2013         Assessment & Plan:

## 2014-08-01 NOTE — Progress Notes (Signed)
Pre visit review using our clinic review tool, if applicable. No additional management support is needed unless otherwise documented below in the visit note. 

## 2014-09-01 ENCOUNTER — Encounter: Payer: Self-pay | Admitting: Internal Medicine

## 2014-09-14 ENCOUNTER — Other Ambulatory Visit: Payer: Self-pay | Admitting: Internal Medicine

## 2014-11-30 ENCOUNTER — Ambulatory Visit: Payer: 59 | Admitting: Internal Medicine

## 2015-01-06 ENCOUNTER — Ambulatory Visit (INDEPENDENT_AMBULATORY_CARE_PROVIDER_SITE_OTHER): Payer: 59 | Admitting: Internal Medicine

## 2015-01-06 ENCOUNTER — Encounter: Payer: Self-pay | Admitting: Internal Medicine

## 2015-01-06 VITALS — BP 130/80 | HR 72 | Temp 97.8°F | Wt 272.0 lb

## 2015-01-06 DIAGNOSIS — G5601 Carpal tunnel syndrome, right upper limb: Secondary | ICD-10-CM | POA: Diagnosis not present

## 2015-01-06 DIAGNOSIS — G5602 Carpal tunnel syndrome, left upper limb: Secondary | ICD-10-CM

## 2015-01-06 DIAGNOSIS — I10 Essential (primary) hypertension: Secondary | ICD-10-CM

## 2015-01-06 DIAGNOSIS — E785 Hyperlipidemia, unspecified: Secondary | ICD-10-CM | POA: Diagnosis not present

## 2015-01-06 DIAGNOSIS — G5603 Carpal tunnel syndrome, bilateral upper limbs: Secondary | ICD-10-CM

## 2015-01-06 MED ORDER — VITAMIN D 1000 UNITS PO TABS: 1000.0000 [IU] | ORAL_TABLET | Freq: Every day | ORAL | Status: DC

## 2015-01-06 MED ORDER — MELOXICAM 7.5 MG PO TABS: 7.5000 mg | ORAL_TABLET | Freq: Every day | ORAL | Status: DC

## 2015-01-06 NOTE — Progress Notes (Signed)
Pre visit review using our clinic review tool, if applicable. No additional management support is needed unless otherwise documented below in the visit note. 

## 2015-01-06 NOTE — Progress Notes (Signed)
Subjective:  Patient ID: Dale Bishop, male    DOB: Apr 30, 1961  Age: 54 y.o. MRN: [id number]  CC: No chief complaint on file.   HPI Dale Bishop presents for HTN, dyslipidemia. C/o R thumb pain  Outpatient Prescriptions Prior to Visit  Medication Sig Dispense Refill  . amLODipine (NORVASC) 10 MG tablet Take 1 tablet (10 mg total) by mouth daily. 90 tablet 3  . atorvastatin (LIPITOR) 40 MG tablet Take 1 tablet (40 mg total) by mouth daily. 90 tablet 1  . carvedilol (COREG) 12.5 MG tablet Take 1 tablet (12.5 mg total) by mouth 2 (two) times daily with a meal. 180 tablet 3  . hydrochlorothiazide (HYDRODIURIL) 25 MG tablet Take 1 tablet (25 mg total) by mouth daily. 90 tablet 3  . loratadine (CLARITIN) 10 MG tablet Take 1 tablet (10 mg total) by mouth daily. 100 tablet 3  . triamcinolone cream (KENALOG) 0.1 % Apply 1 application topically 2 (two) times daily. 30 g 3  . fluticasone (FLONASE) 50 MCG/ACT nasal spray Place 2 sprays into both nostrils daily. (Patient not taking: Reported on 01/06/2015) 16 g 6  . levofloxacin (LEVAQUIN) 500 MG tablet Take 1 tablet (500 mg total) by mouth daily. (Patient not taking: Reported on 01/06/2015) 10 tablet 0   No facility-administered medications prior to visit.    ROS Review of Systems  Constitutional: Negative for appetite change, fatigue and unexpected weight change.  HENT: Negative for congestion, nosebleeds, sneezing, sore throat and trouble swallowing.   Eyes: Negative for itching and visual disturbance.  Respiratory: Negative for cough.   Cardiovascular: Negative for chest pain, palpitations and leg swelling.  Gastrointestinal: Negative for nausea, diarrhea, blood in stool and abdominal distention.  Genitourinary: Negative for frequency and hematuria.  Musculoskeletal: Negative for back pain, joint swelling, gait problem and neck pain.  Skin: Negative for rash.  Neurological: Positive for numbness. Negative for dizziness, tremors, speech  difficulty and weakness.  Psychiatric/Behavioral: Negative for sleep disturbance, dysphoric mood and agitation. The patient is not nervous/anxious.     Objective:  BP 130/80 mmHg  Pulse 72  Temp(Src) 97.8 F (36.6 C)  Wt 272 lb (123.378 kg)  SpO2 97%  BP Readings from Last 3 Encounters:  01/06/15 130/80  08/01/14 140/98  04/19/14 152/100    Wt Readings from Last 3 Encounters:  01/06/15 272 lb (123.378 kg)  08/01/14 277 lb (125.646 kg)  04/19/14 267 lb (121.11 kg)    Physical Exam  Constitutional: He is oriented to person, place, and time. He appears well-developed. No distress.  NAD  HENT:  Mouth/Throat: Oropharynx is clear and moist.  Eyes: Conjunctivae are normal. Pupils are equal, round, and reactive to light.  Neck: Normal range of motion. No JVD present. No thyromegaly present.  Cardiovascular: Normal rate, regular rhythm, normal heart sounds and intact distal pulses.  Exam reveals no gallop and no friction rub.   No murmur heard. Pulmonary/Chest: Effort normal and breath sounds normal. No respiratory distress. He has no wheezes. He has no rales. He exhibits no tenderness.  Abdominal: Soft. Bowel sounds are normal. He exhibits no distension and no mass. There is no tenderness. There is no rebound and no guarding.  Musculoskeletal: Normal range of motion. He exhibits no edema or tenderness.  Lymphadenopathy:    He has no cervical adenopathy.  Neurological: He is alert and oriented to person, place, and time. He has normal reflexes. No cranial nerve deficit. He exhibits normal muscle tone. He displays a negative Romberg sign.  Coordination and gait normal.  Skin: Skin is warm and dry. No rash noted.  Psychiatric: He has a normal mood and affect. His behavior is normal. Judgment and thought content normal.   R thumb ganglion (+) CTS signs B  Lab Results  Component Value Date   WBC 7.7 09/07/2013   HGB 15.2 09/07/2013   HCT 46.3 09/07/2013   PLT 297.0 09/07/2013    GLUCOSE 125* 09/07/2013   CHOL 267* 09/07/2013   TRIG 167.0* 09/07/2013   HDL 48.40 09/07/2013   LDLCALC 185* 09/07/2013   ALT 22 09/07/2013   AST 18 09/07/2013   NA 136 09/07/2013   K 3.6 09/07/2013   CL 101 09/07/2013   CREATININE 1.4 09/07/2013   BUN 17 09/07/2013   CO2 27 09/07/2013   TSH 2.71 09/07/2013    No results found.  Assessment & Plan:     Meds ordered this encounter  Medications  . cholecalciferol (VITAMIN D) 1000 UNITS tablet    Sig: Take 1 tablet (1,000 Units total) by mouth daily.    Dispense:  100 tablet    Refill:  3  . meloxicam (MOBIC) 7.5 MG tablet    Sig: Take 1 tablet (7.5 mg total) by mouth daily.    Dispense:  30 tablet    Refill:  1     Follow-up: Return in about 6 months (around 07/09/2015) for Wellness Exam.  Walker Kehr, MD

## 2015-01-08 DIAGNOSIS — G5600 Carpal tunnel syndrome, unspecified upper limb: Secondary | ICD-10-CM | POA: Insufficient documentation

## 2015-01-08 NOTE — Assessment & Plan Note (Signed)
7/16 B hands - likely work related Pt needs ergonomic work station Meloxicam

## 2015-01-08 NOTE — Assessment & Plan Note (Signed)
On Amlodipine, Coreg 

## 2015-01-08 NOTE — Assessment & Plan Note (Signed)
5/15 on Lipitor

## 2015-02-21 ENCOUNTER — Other Ambulatory Visit: Payer: Self-pay | Admitting: Physician Assistant

## 2015-02-22 ENCOUNTER — Other Ambulatory Visit: Payer: Self-pay | Admitting: *Deleted

## 2015-02-22 MED ORDER — TRIAMCINOLONE ACETONIDE 0.1 % EX CREA: 1.0000 "application " | TOPICAL_CREAM | Freq: Two times a day (BID) | CUTANEOUS | Status: DC

## 2015-08-03 ENCOUNTER — Encounter: Payer: Self-pay | Admitting: Internal Medicine

## 2015-08-03 ENCOUNTER — Ambulatory Visit (INDEPENDENT_AMBULATORY_CARE_PROVIDER_SITE_OTHER): Payer: 59 | Admitting: Internal Medicine

## 2015-08-03 ENCOUNTER — Encounter: Payer: Self-pay | Admitting: Gastroenterology

## 2015-08-03 VITALS — BP 125/85 | HR 79 | Ht 72.0 in | Wt 273.0 lb

## 2015-08-03 DIAGNOSIS — G5603 Carpal tunnel syndrome, bilateral upper limbs: Secondary | ICD-10-CM

## 2015-08-03 DIAGNOSIS — Z0000 Encounter for general adult medical examination without abnormal findings: Secondary | ICD-10-CM | POA: Diagnosis not present

## 2015-08-03 DIAGNOSIS — R9431 Abnormal electrocardiogram [ECG] [EKG]: Secondary | ICD-10-CM

## 2015-08-03 DIAGNOSIS — Z1211 Encounter for screening for malignant neoplasm of colon: Secondary | ICD-10-CM | POA: Diagnosis not present

## 2015-08-03 DIAGNOSIS — I10 Essential (primary) hypertension: Secondary | ICD-10-CM

## 2015-08-03 DIAGNOSIS — D485 Neoplasm of uncertain behavior of skin: Secondary | ICD-10-CM

## 2015-08-03 DIAGNOSIS — E785 Hyperlipidemia, unspecified: Secondary | ICD-10-CM

## 2015-08-03 MED ORDER — HYDROCHLOROTHIAZIDE 25 MG PO TABS: 25.0000 mg | ORAL_TABLET | Freq: Every day | ORAL | Status: DC

## 2015-08-03 MED ORDER — AMLODIPINE BESYLATE 10 MG PO TABS
10.0000 mg | ORAL_TABLET | Freq: Every day | ORAL | Status: DC
Start: 2015-08-03 — End: 2016-09-30

## 2015-08-03 MED ORDER — CARVEDILOL 12.5 MG PO TABS: 12.5000 mg | ORAL_TABLET | Freq: Two times a day (BID) | ORAL | Status: DC

## 2015-08-03 MED FILL — CARVEDILOL 12.5 MG TABLET: 12.5 | 90 days supply | Qty: 180 | Fill #0

## 2015-08-03 NOTE — Assessment & Plan Note (Addendum)
2017 using R brace Labs

## 2015-08-03 NOTE — Assessment & Plan Note (Signed)
  On Amlodipine, Coreg 

## 2015-08-03 NOTE — Progress Notes (Signed)
Pre visit review using our clinic review tool, if applicable. No additional management support is needed unless otherwise documented below in the visit note. 

## 2015-08-03 NOTE — Progress Notes (Signed)
Subjective:  Patient ID: Dale Bishop, male    DOB: Oct 07, 1960  Age: 55 y.o. MRN: [id number]  CC: Annual Exam   HPI Dale Bishop presents for a well exam. F/u CTS, dyslipidemia  Outpatient Prescriptions Prior to Visit  Medication Sig Dispense Refill  . amLODipine (NORVASC) 10 MG tablet Take 1 tablet (10 mg total) by mouth daily. 90 tablet 3  . carvedilol (COREG) 12.5 MG tablet Take 1 tablet (12.5 mg total) by mouth 2 (two) times daily with a meal. 180 tablet 3  . cholecalciferol (VITAMIN D) 1000 UNITS tablet Take 1 tablet (1,000 Units total) by mouth daily. 100 tablet 3  . fluticasone (FLONASE) 50 MCG/ACT nasal spray Place 2 sprays into both nostrils daily. 16 g 6  . hydrochlorothiazide (HYDRODIURIL) 25 MG tablet Take 1 tablet (25 mg total) by mouth daily. 90 tablet 3  . loratadine (CLARITIN) 10 MG tablet Take 1 tablet (10 mg total) by mouth daily. 100 tablet 3  . meloxicam (MOBIC) 7.5 MG tablet Take 1 tablet (7.5 mg total) by mouth daily. 30 tablet 1  . atorvastatin (LIPITOR) 40 MG tablet Take 1 tablet (40 mg total) by mouth daily. (Patient not taking: Reported on 08/03/2015) 90 tablet 1  . triamcinolone cream (KENALOG) 0.1 % Apply 1 application topically 2 (two) times daily. (Patient not taking: Reported on 08/03/2015) 30 g 3   No facility-administered medications prior to visit.    ROS Review of Systems  Constitutional: Negative for appetite change, fatigue and unexpected weight change.  HENT: Negative for congestion, nosebleeds, sneezing, sore throat and trouble swallowing.   Eyes: Negative for itching and visual disturbance.  Respiratory: Negative for cough.   Cardiovascular: Negative for chest pain, palpitations and leg swelling.  Gastrointestinal: Negative for nausea, diarrhea, blood in stool and abdominal distention.  Genitourinary: Negative for frequency and hematuria.  Musculoskeletal: Negative for back pain, joint swelling, gait problem and neck pain.  Skin: Negative  for rash.  Neurological: Negative for dizziness, tremors, speech difficulty and weakness.  Psychiatric/Behavioral: Negative for suicidal ideas, sleep disturbance, dysphoric mood and agitation. The patient is not nervous/anxious.     Objective:  BP 130/90 mmHg  Pulse 79  Ht 6' (1.829 m)  Wt 273 lb (123.832 kg)  BMI 37.02 kg/m2  SpO2 95%  BP Readings from Last 3 Encounters:  08/03/15 130/90  01/06/15 130/80  08/01/14 140/98    Wt Readings from Last 3 Encounters:  08/03/15 273 lb (123.832 kg)  01/06/15 272 lb (123.378 kg)  08/01/14 277 lb (125.646 kg)    Physical Exam  Constitutional: He is oriented to person, place, and time. He appears well-developed. No distress.  NAD  HENT:  Mouth/Throat: Oropharynx is clear and moist.  Eyes: Conjunctivae are normal. Pupils are equal, round, and reactive to light.  Neck: Normal range of motion. No JVD present. No thyromegaly present.  Cardiovascular: Normal rate, regular rhythm, normal heart sounds and intact distal pulses.  Exam reveals no gallop and no friction rub.   No murmur heard. Pulmonary/Chest: Effort normal and breath sounds normal. No respiratory distress. He has no wheezes. He has no rales. He exhibits no tenderness.  Abdominal: Soft. Bowel sounds are normal. He exhibits no distension and no mass. There is no tenderness. There is no rebound and no guarding.  Musculoskeletal: Normal range of motion. He exhibits no edema or tenderness.  Lymphadenopathy:    He has no cervical adenopathy.  Neurological: He is alert and oriented to person, place, and time.  He has normal reflexes. No cranial nerve deficit. He exhibits normal muscle tone. He displays a negative Romberg sign. Coordination and gait normal.  Skin: Skin is warm and dry. No rash noted.  Psychiatric: He has a normal mood and affect. His behavior is normal. Judgment and thought content normal.    Lab Results  Component Value Date   WBC 7.7 09/07/2013   HGB 15.2  09/07/2013   HCT 46.3 09/07/2013   PLT 297.0 09/07/2013   GLUCOSE 125* 09/07/2013   CHOL 267* 09/07/2013   TRIG 167.0* 09/07/2013   HDL 48.40 09/07/2013   LDLCALC 185* 09/07/2013   ALT 22 09/07/2013   AST 18 09/07/2013   NA 136 09/07/2013   K 3.6 09/07/2013   CL 101 09/07/2013   CREATININE 1.4 09/07/2013   BUN 17 09/07/2013   CO2 27 09/07/2013   TSH 2.71 09/07/2013    No results found.  Assessment & Plan:   Dale Bishop was seen today for annual exam.  Diagnoses and all orders for this visit:  Essential hypertension -     amLODipine (NORVASC) 10 MG tablet; Take 1 tablet (10 mg total) by mouth daily.  Routine general medical examination at a health care facility -     amLODipine (NORVASC) 10 MG tablet; Take 1 tablet (10 mg total) by mouth daily.  Abnormal ECG -     amLODipine (NORVASC) 10 MG tablet; Take 1 tablet (10 mg total) by mouth daily.  Screening for colon cancer -     amLODipine (NORVASC) 10 MG tablet; Take 1 tablet (10 mg total) by mouth daily.  Other orders -     carvedilol (COREG) 12.5 MG tablet; Take 1 tablet (12.5 mg total) by mouth 2 (two) times daily with a meal. -     hydrochlorothiazide (HYDRODIURIL) 25 MG tablet; Take 1 tablet (25 mg total) by mouth daily.   I am having Dale Bishop maintain his atorvastatin, fluticasone, loratadine, carvedilol, amLODipine, hydrochlorothiazide, cholecalciferol, meloxicam, and triamcinolone cream.  No orders of the defined types were placed in this encounter.     Follow-up: No Follow-up on file.  Walker Kehr, MD

## 2015-08-03 NOTE — Assessment & Plan Note (Signed)
We discussed age appropriate health related issues, including available/recomended screening tests and vaccinations. We discussed a need for adhering to healthy diet and exercise. Labs/EKG were reviewed/ordered. All questions were answered. Schedule a colonoscopy Labs

## 2015-08-03 NOTE — Assessment & Plan Note (Addendum)
Off Lipitor 2016 -17 off Lipitor due to pains 07/2015 - re-start Lipitor if tolerated

## 2015-08-08 ENCOUNTER — Other Ambulatory Visit (INDEPENDENT_AMBULATORY_CARE_PROVIDER_SITE_OTHER): Payer: 59

## 2015-08-08 ENCOUNTER — Other Ambulatory Visit: Payer: Self-pay | Admitting: Internal Medicine

## 2015-08-08 DIAGNOSIS — G5603 Carpal tunnel syndrome, bilateral upper limbs: Secondary | ICD-10-CM | POA: Diagnosis not present

## 2015-08-08 DIAGNOSIS — E785 Hyperlipidemia, unspecified: Secondary | ICD-10-CM | POA: Diagnosis not present

## 2015-08-08 DIAGNOSIS — I10 Essential (primary) hypertension: Secondary | ICD-10-CM

## 2015-08-08 DIAGNOSIS — Z0000 Encounter for general adult medical examination without abnormal findings: Secondary | ICD-10-CM

## 2015-08-08 LAB — HEPATIC FUNCTION PANEL
ALT: 15 U/L (ref 0–53)
AST: 12 U/L (ref 0–37)
Albumin: 4.4 g/dL (ref 3.5–5.2)
Alkaline Phosphatase: 45 U/L (ref 39–117)
BILIRUBIN DIRECT: 0.1 mg/dL (ref 0.0–0.3)
BILIRUBIN TOTAL: 0.5 mg/dL (ref 0.2–1.2)
Total Protein: 7.8 g/dL (ref 6.0–8.3)

## 2015-08-08 LAB — LIPID PANEL
Cholesterol: 284 mg/dL — ABNORMAL HIGH (ref 0–200)
HDL: 49.3 mg/dL (ref >39.00)
LDL Cholesterol: 202 mg/dL — ABNORMAL HIGH (ref 0–99)
NonHDL: 234.44
Total CHOL/HDL Ratio: 6
Triglycerides: 162 mg/dL — ABNORMAL HIGH (ref 0.0–149.0)
VLDL: 32.4 mg/dL (ref 0.0–40.0)

## 2015-08-08 LAB — CBC WITH DIFFERENTIAL/PLATELET
BASOS PCT: 0.6 % (ref 0.0–3.0)
Basophils Absolute: 0 10*3/uL (ref 0.0–0.1)
EOS PCT: 3.4 % (ref 0.0–5.0)
Eosinophils Absolute: 0.2 10*3/uL (ref 0.0–0.7)
HCT: 42 % (ref 39.0–52.0)
Hemoglobin: 13.9 g/dL (ref 13.0–17.0)
LYMPHS ABS: 1.9 10*3/uL (ref 0.7–4.0)
Lymphocytes Relative: 26.7 % (ref 12.0–46.0)
MCHC: 33.2 g/dL (ref 30.0–36.0)
MCV: 82 fl (ref 78.0–100.0)
MONO ABS: 0.8 10*3/uL (ref 0.1–1.0)
MONOS PCT: 10.7 % (ref 3.0–12.0)
NEUTROS ABS: 4.3 10*3/uL (ref 1.4–7.7)
NEUTROS PCT: 58.6 % (ref 43.0–77.0)
PLATELETS: 324 10*3/uL (ref 150.0–400.0)
RBC: 5.12 Mil/uL (ref 4.22–5.81)
RDW: 14.5 % (ref 11.5–15.5)
WBC: 7.3 10*3/uL (ref 4.0–10.5)

## 2015-08-08 LAB — BASIC METABOLIC PANEL
BUN: 14 mg/dL (ref 6–23)
CO2: 29 meq/L (ref 19–32)
Calcium: 9.9 mg/dL (ref 8.4–10.5)
Chloride: 101 mEq/L (ref 96–112)
Creatinine, Ser: 1.28 mg/dL (ref 0.40–1.50)
GFR: 75.49 mL/min (ref >60.00)
GLUCOSE: 137 mg/dL — AB (ref 70–99)
POTASSIUM: 4 meq/L (ref 3.5–5.1)
SODIUM: 139 meq/L (ref 135–145)

## 2015-08-08 LAB — URINALYSIS
Bilirubin Urine: NEGATIVE
Ketones, ur: NEGATIVE
LEUKOCYTES UA: NEGATIVE
NITRITE: NEGATIVE
Specific Gravity, Urine: 1.025 (ref 1.000–1.030)
Total Protein, Urine: NEGATIVE
UROBILINOGEN UA: 0.2 (ref 0.0–1.0)
Urine Glucose: NEGATIVE
pH: 6 (ref 5.0–8.0)

## 2015-08-08 LAB — TSH: TSH: 2.02 u[IU]/mL (ref 0.35–4.50)

## 2015-08-08 LAB — PSA: PSA: 1.08 ng/mL (ref 0.10–4.00)

## 2015-08-08 LAB — VITAMIN B12: VITAMIN B 12: 314 pg/mL (ref 211–911)

## 2015-08-08 LAB — VITAMIN D 25 HYDROXY (VIT D DEFICIENCY, FRACTURES): VITD: 15.82 ng/mL — ABNORMAL LOW (ref 30.00–100.00)

## 2015-08-08 MED ORDER — VITAMIN D 1000 UNITS PO TABS: 2000.0000 [IU] | ORAL_TABLET | Freq: Every day | ORAL | Status: AC

## 2015-08-08 MED ORDER — ERGOCALCIFEROL 1.25 MG (50000 UT) PO CAPS: 50000.0000 [IU] | ORAL_CAPSULE | ORAL | Status: DC

## 2015-08-09 LAB — HEPATITIS C ANTIBODY: HCV AB: NEGATIVE

## 2015-08-09 MED FILL — VIT D2 1.25 MG (50,000 UNIT: 1.25 MG | 42 days supply | Qty: 6 | Fill #0

## 2015-08-31 MED FILL — HYDROCHLOROTHIAZIDE 25 MG T: 25 | 90 days supply | Qty: 90 | Fill #0

## 2015-09-01 ENCOUNTER — Other Ambulatory Visit: Payer: Self-pay | Admitting: *Deleted

## 2015-09-01 MED ORDER — ROSUVASTATIN CALCIUM 10 MG PO TABS: 10.0000 mg | ORAL_TABLET | Freq: Every day | ORAL | Status: DC

## 2015-09-01 MED FILL — ROSUVASTATIN CALCIUM 10 MG: 10 | 90 days supply | Qty: 90 | Fill #0

## 2015-09-01 NOTE — Telephone Encounter (Signed)
Done. Thx.

## 2015-09-01 NOTE — Telephone Encounter (Signed)
Rec a fax from pharmacy requesting script for Crestor per the patient. I do not see Crestor on list. Please advise.

## 2015-09-04 MED FILL — AMLODIPINE BESYLATE 10 MG T: 10 | 90 days supply | Qty: 90 | Fill #0

## 2015-09-04 NOTE — Telephone Encounter (Signed)
MD already sent prescription closing encounter...Johny Chess

## 2015-09-20 ENCOUNTER — Ambulatory Visit (AMBULATORY_SURGERY_CENTER): Payer: Self-pay | Admitting: *Deleted

## 2015-09-20 VITALS — Ht 72.0 in | Wt 274.0 lb

## 2015-09-20 DIAGNOSIS — Z1211 Encounter for screening for malignant neoplasm of colon: Secondary | ICD-10-CM

## 2015-09-20 NOTE — Progress Notes (Signed)
No allergies to eggs or soy. No prior anesthesia.  Pt given Emmi instructions for colonoscopy  No oxygen use  No diet drug use  

## 2015-10-04 ENCOUNTER — Ambulatory Visit (AMBULATORY_SURGERY_CENTER): Payer: 59 | Admitting: Gastroenterology

## 2015-10-04 ENCOUNTER — Encounter: Payer: Self-pay | Admitting: Gastroenterology

## 2015-10-04 VITALS — BP 127/90 | HR 64 | Temp 97.8°F | Resp 17 | Ht 72.0 in | Wt 274.0 lb

## 2015-10-04 DIAGNOSIS — I10 Essential (primary) hypertension: Secondary | ICD-10-CM | POA: Diagnosis not present

## 2015-10-04 DIAGNOSIS — Z1211 Encounter for screening for malignant neoplasm of colon: Secondary | ICD-10-CM | POA: Diagnosis present

## 2015-10-04 MED ORDER — SODIUM CHLORIDE 0.9 % IV SOLN: 500.0000 mL | INTRAVENOUS | Status: DC

## 2015-10-04 NOTE — Op Note (Signed)
[facility] Patient Name: Kaien Vaden Procedure Date: 10/04/2015 8:41 AM MRN: [id number] Endoscopist: Mallie Mussel L. Loletha Carrow , MD Age: 55 Date of Birth: November 22, 1960 Gender: Male Procedure:                Colonoscopy Indications:              Screening for colorectal malignant neoplasm Medicines:                Monitored Anesthesia Care Procedure:                Pre-Anesthesia Assessment:                           - Prior to the procedure, a History and Physical                            was performed, and patient medications and                            allergies were reviewed. The patient's tolerance of                            previous anesthesia was also reviewed. The risks                            and benefits of the procedure and the sedation                            options and risks were discussed with the patient.                            All questions were answered, and informed consent                            was obtained. Prior Anticoagulants: The patient has                            taken no previous anticoagulant or antiplatelet                            agents. ASA Grade Assessment: II - A patient with                            mild systemic disease. After reviewing the risks                            and benefits, the patient was deemed in                            satisfactory condition to undergo the procedure.                           After obtaining informed consent, the colonoscope  was passed under direct vision. Throughout the                            procedure, the patient's blood pressure, pulse, and                            oxygen saturations were monitored continuously. The                            Model CF-HQ190L [id number]) scope was introduced                            through the anus and advanced to the the cecum,                            identified by appendiceal orifice and ileocecal                    valve. The colonoscopy was performed without                            difficulty. The patient tolerated the procedure                            well. The quality of the bowel preparation was                            excellent. The ileocecal valve, appendiceal                            orifice, and rectum were photographed. The bowel                            preparation used was Miralax. The quality of the                            bowel preparation was evaluated using the BBPS                            [facility] Bowel Preparation Scale) with scores of:                            Right Colon = 3, Transverse Colon = 2 and Left                            Colon = 3. The total BBPS score equals 8. Scope In: 9:01:27 AM Scope Out: 9:11:16 AM Scope Withdrawal Time: 0 hours 7 minutes 53 seconds  Total Procedure Duration: 0 hours 9 minutes 49 seconds  Findings:                 The perianal and digital rectal examinations were                            normal.  The entire examined colon appeared normal on direct                            and retroflexion views. Complications:            No immediate complications. Estimated Blood Loss:     Estimated blood loss: none. Impression:               - The entire examined colon is normal on direct and                            retroflexion views.                           - No specimens collected. Recommendation:           - Patient has a contact number available for                            emergencies. The signs and symptoms of potential                            delayed complications were discussed with the                            patient. Return to normal activities tomorrow.                            Written discharge instructions were provided to the                            patient.                           - Resume previous diet.                           - Continue present  medications.                           - Repeat colonoscopy in 10 years for screening                            purposes. Breckyn Troyer L. Loletha Carrow, MD 10/04/2015 9:18:02 AM This report has been signed electronically.

## 2015-10-04 NOTE — Progress Notes (Signed)
No problems noted in the recovery room. maw 

## 2015-10-04 NOTE — Patient Instructions (Signed)
YOU HAD AN ENDOSCOPIC PROCEDURE TODAY AT [facility]:   Refer to the procedure report that was given to you for any specific questions about what was found during the examination.  If the procedure report does not answer your questions, please call your gastroenterologist to clarify.  If you requested that your care partner not be given the details of your procedure findings, then the procedure report has been included in a sealed envelope for you to review at your convenience later.  YOU SHOULD EXPECT: Some feelings of bloating in the abdomen. Passage of more gas than usual.  Walking can help get rid of the air that was put into your GI tract during the procedure and reduce the bloating. If you had a lower endoscopy (such as a colonoscopy or flexible sigmoidoscopy) you may notice spotting of blood in your stool or on the toilet paper. If you underwent a bowel prep for your procedure, you may not have a normal bowel movement for a few days.  Please Note:  You might notice some irritation and congestion in your nose or some drainage.  This is from the oxygen used during your procedure.  There is no need for concern and it should clear up in a day or so.  SYMPTOMS TO REPORT IMMEDIATELY:   Following lower endoscopy (colonoscopy or flexible sigmoidoscopy):  Excessive amounts of blood in the stool  Significant tenderness or worsening of abdominal pains  Swelling of the abdomen that is new, acute  Fever of 100F or higher   For urgent or emergent issues, a gastroenterologist can be reached at any hour by calling [phone].   DIET: Your first meal following the procedure should be a small meal and then it is ok to progress to your normal diet. Heavy or fried foods are harder to digest and may make you feel nauseous or bloated.  Likewise, meals heavy in dairy and vegetables can increase bloating.  Drink plenty of fluids but you should avoid alcoholic beverages for 24  hours.  ACTIVITY:  You should plan to take it easy for the rest of today and you should NOT DRIVE or use heavy machinery until tomorrow (because of the sedation medicines used during the test).    FOLLOW UP: Our staff will call the number listed on your records the next business day following your procedure to check on you and address any questions or concerns that you may have regarding the information given to you following your procedure. If we do not reach you, we will leave a message.  However, if you are feeling well and you are not experiencing any problems, there is no need to return our call.  We will assume that you have returned to your regular daily activities without incident.  If any biopsies were taken you will be contacted by phone or by letter within the next 1-3 weeks.  Please call us at [phone] if you have not heard about the biopsies in 3 weeks.    SIGNATURES/CONFIDENTIALITY: You and/or your care partner have signed paperwork which will be entered into your electronic medical record.  These signatures attest to the fact that that the information above on your After Visit Summary has been reviewed and is understood.  Full responsibility of the confidentiality of this discharge information lies with you and/or your care-partner.   You may resume your current medications today. Repeat your next screening colonoscopy in 10 years. Please call if any questions or concerns.

## 2015-10-04 NOTE — Progress Notes (Signed)
Report to PACU, RN, vss, BBS= Clear.  

## 2015-10-05 ENCOUNTER — Telehealth: Payer: Self-pay

## 2015-10-05 NOTE — Telephone Encounter (Signed)
Left message on answering machine. 

## 2015-12-04 MED FILL — ROSUVASTATIN CALCIUM 10 MG: 10 | 90 days supply | Qty: 90 | Fill #1

## 2015-12-04 MED FILL — HYDROCHLOROTHIAZIDE 25 MG T: 25 | 90 days supply | Qty: 90 | Fill #1

## 2015-12-17 MED FILL — AMLODIPINE BESYLATE 10 MG T: 10 | 90 days supply | Qty: 90 | Fill #1

## 2016-03-05 MED FILL — CARVEDILOL 12.5 MG TABLET: 12.5 | 90 days supply | Qty: 180 | Fill #1

## 2016-03-05 MED FILL — HYDROCHLOROTHIAZIDE 25 MG T: 25 | 90 days supply | Qty: 90 | Fill #2

## 2016-03-10 MED FILL — ROSUVASTATIN CALCIUM 10 MG: 10 | 90 days supply | Qty: 90 | Fill #2

## 2016-03-11 MED FILL — AMLODIPINE BESYLATE 10 MG T: 10 | 90 days supply | Qty: 90 | Fill #2

## 2016-06-01 DIAGNOSIS — H5213 Myopia, bilateral: Secondary | ICD-10-CM | POA: Diagnosis not present

## 2016-06-01 DIAGNOSIS — H524 Presbyopia: Secondary | ICD-10-CM | POA: Diagnosis not present

## 2016-06-11 MED FILL — HYDROCHLOROTHIAZIDE 25 MG T: 25 | 90 days supply | Qty: 90 | Fill #3

## 2016-06-11 MED FILL — ROSUVASTATIN CALCIUM 10 MG: 10 | 90 days supply | Qty: 90 | Fill #3

## 2016-06-24 MED FILL — AMLODIPINE BESYLATE 10 MG T: 10 | 90 days supply | Qty: 90 | Fill #3

## 2016-08-05 ENCOUNTER — Encounter: Payer: Self-pay | Admitting: Internal Medicine

## 2016-08-05 ENCOUNTER — Ambulatory Visit (INDEPENDENT_AMBULATORY_CARE_PROVIDER_SITE_OTHER): Payer: 59 | Admitting: Internal Medicine

## 2016-08-05 VITALS — BP 120/76 | HR 77 | Temp 97.0°F | Resp 16 | Ht 71.0 in | Wt 264.0 lb

## 2016-08-05 DIAGNOSIS — M170 Bilateral primary osteoarthritis of knee: Secondary | ICD-10-CM

## 2016-08-05 DIAGNOSIS — E118 Type 2 diabetes mellitus with unspecified complications: Secondary | ICD-10-CM | POA: Insufficient documentation

## 2016-08-05 DIAGNOSIS — M1710 Unilateral primary osteoarthritis, unspecified knee: Secondary | ICD-10-CM | POA: Insufficient documentation

## 2016-08-05 DIAGNOSIS — I10 Essential (primary) hypertension: Secondary | ICD-10-CM

## 2016-08-05 DIAGNOSIS — M1712 Unilateral primary osteoarthritis, left knee: Secondary | ICD-10-CM | POA: Insufficient documentation

## 2016-08-05 DIAGNOSIS — E559 Vitamin D deficiency, unspecified: Secondary | ICD-10-CM

## 2016-08-05 DIAGNOSIS — E785 Hyperlipidemia, unspecified: Secondary | ICD-10-CM | POA: Diagnosis not present

## 2016-08-05 DIAGNOSIS — Z0000 Encounter for general adult medical examination without abnormal findings: Secondary | ICD-10-CM | POA: Diagnosis not present

## 2016-08-05 DIAGNOSIS — R7309 Other abnormal glucose: Secondary | ICD-10-CM

## 2016-08-05 DIAGNOSIS — M179 Osteoarthritis of knee, unspecified: Secondary | ICD-10-CM | POA: Insufficient documentation

## 2016-08-05 NOTE — Progress Notes (Signed)
Subjective:  Patient ID: Dale Bishop, male    DOB: 12-11-1960  Age: 56 y.o. MRN: [id number]  CC: Annual Exam   HPI Dale Bishop presents for a well exam C/o severe B knee pain - chronic; worse lately  Outpatient Medications Prior to Visit  Medication Sig Dispense Refill  . amLODipine (NORVASC) 10 MG tablet Take 1 tablet (10 mg total) by mouth daily. 90 tablet 3  . atorvastatin (LIPITOR) 40 MG tablet Take 1 tablet (40 mg total) by mouth daily. 90 tablet 1  . carvedilol (COREG) 12.5 MG tablet Take 1 tablet (12.5 mg total) by mouth 2 (two) times daily with a meal. 180 tablet 3  . cholecalciferol (VITAMIN D) 1000 units tablet Take 2 tablets (2,000 Units total) by mouth daily. 100 tablet 3  . hydrochlorothiazide (HYDRODIURIL) 25 MG tablet Take 1 tablet (25 mg total) by mouth daily. 90 tablet 3  . Bisacodyl (DULCOLAX PO) Take by mouth as directed. Dulcolax as directed for colonoscopy prep    . fluticasone (FLONASE) 50 MCG/ACT nasal spray Place 2 sprays into both nostrils daily. (Patient not taking: Reported on 09/20/2015) 16 g 6  . loratadine (CLARITIN) 10 MG tablet Take 1 tablet (10 mg total) by mouth daily. 100 tablet 3  . meloxicam (MOBIC) 7.5 MG tablet Take 1 tablet (7.5 mg total) by mouth daily. (Patient not taking: Reported on 09/20/2015) 30 tablet 1  . Polyethylene Glycol 3350 (MIRALAX PO) Take by mouth as directed. Miralax 238 Grams as directed for colonoscopy prep    . rosuvastatin (CRESTOR) 10 MG tablet Take 1 tablet (10 mg total) by mouth daily. 90 tablet 3  . triamcinolone cream (KENALOG) 0.1 % Apply 1 application topically 2 (two) times daily. (Patient not taking: Reported on 08/03/2015) 30 g 3   No facility-administered medications prior to visit.     ROS Review of Systems  Constitutional: Negative for appetite change, fatigue and unexpected weight change.  HENT: Negative for congestion, nosebleeds, sneezing, sore throat and trouble swallowing.   Eyes: Negative for itching and  visual disturbance.  Respiratory: Negative for cough.   Cardiovascular: Negative for chest pain, palpitations and leg swelling.  Gastrointestinal: Negative for abdominal distention, blood in stool, diarrhea and nausea.  Genitourinary: Negative for frequency and hematuria.  Musculoskeletal: Positive for arthralgias and gait problem. Negative for back pain, joint swelling and neck pain.  Skin: Negative for rash.  Neurological: Negative for dizziness, tremors, speech difficulty and weakness.  Psychiatric/Behavioral: Negative for agitation, dysphoric mood, sleep disturbance and suicidal ideas. The patient is not nervous/anxious.     Objective:  BP 120/76   Pulse 77   Temp 97 F (36.1 C) (Oral)   Resp 16   Ht 5\' 11"  (1.803 m)   Wt 264 lb (119.7 kg)   SpO2 97%   BMI 36.82 kg/m   BP Readings from Last 3 Encounters:  08/05/16 120/76  10/04/15 127/90  08/03/15 125/85    Wt Readings from Last 3 Encounters:  08/05/16 264 lb (119.7 kg)  10/04/15 274 lb (124.3 kg)  09/20/15 274 lb (124.3 kg)    Physical Exam  Constitutional: He is oriented to person, place, and time. He appears well-developed. No distress.  NAD  HENT:  Mouth/Throat: Oropharynx is clear and moist.  Eyes: Conjunctivae are normal. Pupils are equal, round, and reactive to light.  Neck: Normal range of motion. No JVD present. No thyromegaly present.  Cardiovascular: Normal rate, regular rhythm, normal heart sounds and intact distal pulses.  Exam reveals no gallop and no friction rub.   No murmur heard. Pulmonary/Chest: Effort normal and breath sounds normal. No respiratory distress. He has no wheezes. He has no rales. He exhibits no tenderness.  Abdominal: Soft. Bowel sounds are normal. He exhibits no distension and no mass. There is no tenderness. There is no rebound and no guarding.  Musculoskeletal: Normal range of motion. He exhibits no edema or tenderness.  Lymphadenopathy:    He has no cervical adenopathy.    Neurological: He is alert and oriented to person, place, and time. He has normal reflexes. No cranial nerve deficit. He exhibits normal muscle tone. He displays a negative Romberg sign. Coordination and gait normal.  Skin: Skin is warm and dry. No rash noted.  Psychiatric: He has a normal mood and affect. His behavior is normal. Judgment and thought content normal.  B knees tender Pt declined rectal exam  Lab Results  Component Value Date   WBC 7.3 08/08/2015   HGB 13.9 08/08/2015   HCT 42.0 08/08/2015   PLT 324.0 08/08/2015   GLUCOSE 137 (H) 08/08/2015   CHOL 284 (H) 08/08/2015   TRIG 162.0 (H) 08/08/2015   HDL 49.30 08/08/2015   LDLCALC 202 (H) 08/08/2015   ALT 15 08/08/2015   AST 12 08/08/2015   NA 139 08/08/2015   K 4.0 08/08/2015   CL 101 08/08/2015   CREATININE 1.28 08/08/2015   BUN 14 08/08/2015   CO2 29 08/08/2015   TSH 2.02 08/08/2015   PSA 1.08 08/08/2015    No results found.  Assessment & Plan:   There are no diagnoses linked to this encounter. I have discontinued Mr. Welliver's fluticasone, loratadine, meloxicam, triamcinolone cream, rosuvastatin, Polyethylene Glycol 3350 (MIRALAX PO), and Bisacodyl (DULCOLAX PO). I am also having him maintain his atorvastatin, amLODipine, carvedilol, hydrochlorothiazide, cholecalciferol, and cetirizine.  Meds ordered this encounter  Medications  . cetirizine (ZYRTEC) 10 MG tablet    Sig: Take 10 mg by mouth daily.     Follow-up: No Follow-up on file.  Walker Kehr, MD

## 2016-08-05 NOTE — Assessment & Plan Note (Signed)
Dr Wynelle Link ref

## 2016-08-05 NOTE — Assessment & Plan Note (Signed)
Pt lost wt Labs 

## 2016-08-05 NOTE — Patient Instructions (Signed)
Shingrix

## 2016-08-05 NOTE — Progress Notes (Signed)
Pre-visit discussion using our clinic review tool. No additional management support is needed unless otherwise documented below in the visit note.  

## 2016-08-05 NOTE — Assessment & Plan Note (Signed)
Amlodipine, Coreg 

## 2016-08-05 NOTE — Assessment & Plan Note (Signed)
Labs

## 2016-08-05 NOTE — Assessment & Plan Note (Addendum)
We discussed age appropriate health related issues, including available/recomended screening tests and vaccinations. We discussed a need for adhering to healthy diet and exercise. Labs/EKG were reviewed/ordered. All questions were answered.  Colon 4/17 due in 2027 Shingrix info

## 2016-08-05 NOTE — Assessment & Plan Note (Signed)
Vit D 

## 2016-08-22 DIAGNOSIS — M1712 Unilateral primary osteoarthritis, left knee: Secondary | ICD-10-CM | POA: Diagnosis not present

## 2016-08-22 DIAGNOSIS — M1711 Unilateral primary osteoarthritis, right knee: Secondary | ICD-10-CM | POA: Diagnosis not present

## 2016-08-22 DIAGNOSIS — M170 Bilateral primary osteoarthritis of knee: Secondary | ICD-10-CM | POA: Diagnosis not present

## 2016-09-10 ENCOUNTER — Other Ambulatory Visit: Payer: Self-pay | Admitting: Internal Medicine

## 2016-09-10 MED FILL — CARVEDILOL 12.5 MG TABLET: 12.5 | 90 days supply | Qty: 180 | Fill #0

## 2016-09-10 MED FILL — ROSUVASTATIN CALCIUM 10 MG: 10 | 90 days supply | Qty: 90 | Fill #0

## 2016-09-10 MED FILL — HYDROCHLOROTHIAZIDE 25 MG T: 25 | 90 days supply | Qty: 90 | Fill #0

## 2016-09-30 ENCOUNTER — Other Ambulatory Visit: Payer: Self-pay | Admitting: Internal Medicine

## 2016-10-01 MED FILL — AMLODIPINE BESYLATE 10 MG T: 10 | 90 days supply | Qty: 90 | Fill #0

## 2016-12-14 MED FILL — HYDROCHLOROTHIAZIDE 25 MG T: 25 | 90 days supply | Qty: 90 | Fill #1

## 2016-12-16 MED FILL — ROSUVASTATIN CALCIUM 10 MG: 10 | 90 days supply | Qty: 90 | Fill #1

## 2016-12-30 MED FILL — AMLODIPINE BESYLATE 10 MG T: 10 | 90 days supply | Qty: 90 | Fill #1

## 2017-03-12 MED FILL — HYDROCHLOROTHIAZIDE 25 MG T: 25 | 90 days supply | Qty: 90 | Fill #2

## 2017-03-13 MED FILL — CARVEDILOL 12.5 MG TABS: 12.5 | 90 days supply | Qty: 180 | Fill #1

## 2017-03-19 ENCOUNTER — Other Ambulatory Visit: Payer: Self-pay | Admitting: Internal Medicine

## 2017-03-20 MED FILL — ROSUVASTATIN CALCIUM 10 MG: 10 | 90 days supply | Qty: 90 | Fill #2

## 2017-03-21 MED FILL — TRIAMCINOLONE ACETONIDE 0.1: 0.1 | 15 days supply | Qty: 30 | Fill #0

## 2017-04-13 MED FILL — AMLODIPINE BESYLATE 10 MG T: 10 | 90 days supply | Qty: 90 | Fill #2

## 2017-07-16 MED FILL — HYDROCHLOROTHIAZIDE 25 MG T: 25 | 90 days supply | Qty: 90 | Fill #3

## 2017-08-06 ENCOUNTER — Ambulatory Visit (INDEPENDENT_AMBULATORY_CARE_PROVIDER_SITE_OTHER)
Admission: RE | Admit: 2017-08-06 | Discharge: 2017-08-06 | Disposition: A | Discharge status: Home / Self Care | Payer: 59 | Source: Ambulatory Visit | Attending: Internal Medicine | Admitting: Internal Medicine

## 2017-08-06 ENCOUNTER — Other Ambulatory Visit (INDEPENDENT_AMBULATORY_CARE_PROVIDER_SITE_OTHER): Payer: 59

## 2017-08-06 ENCOUNTER — Ambulatory Visit (INDEPENDENT_AMBULATORY_CARE_PROVIDER_SITE_OTHER): Payer: 59 | Admitting: Internal Medicine

## 2017-08-06 ENCOUNTER — Encounter: Payer: Self-pay | Admitting: Internal Medicine

## 2017-08-06 VITALS — BP 128/82 | HR 63 | Temp 97.6°F | Ht 71.0 in | Wt 270.0 lb

## 2017-08-06 DIAGNOSIS — I10 Essential (primary) hypertension: Secondary | ICD-10-CM | POA: Diagnosis not present

## 2017-08-06 DIAGNOSIS — R7309 Other abnormal glucose: Secondary | ICD-10-CM

## 2017-08-06 DIAGNOSIS — M533 Sacrococcygeal disorders, not elsewhere classified: Secondary | ICD-10-CM

## 2017-08-06 DIAGNOSIS — Z0000 Encounter for general adult medical examination without abnormal findings: Secondary | ICD-10-CM | POA: Diagnosis not present

## 2017-08-06 DIAGNOSIS — M48061 Spinal stenosis, lumbar region without neurogenic claudication: Secondary | ICD-10-CM | POA: Diagnosis not present

## 2017-08-06 DIAGNOSIS — E559 Vitamin D deficiency, unspecified: Secondary | ICD-10-CM | POA: Diagnosis not present

## 2017-08-06 DIAGNOSIS — R29898 Other symptoms and signs involving the musculoskeletal system: Secondary | ICD-10-CM | POA: Diagnosis not present

## 2017-08-06 LAB — CBC WITH DIFFERENTIAL/PLATELET
BASOS ABS: 0.1 10*3/uL (ref 0.0–0.1)
Basophils Relative: 0.9 % (ref 0.0–3.0)
EOS ABS: 0.2 10*3/uL (ref 0.0–0.7)
Eosinophils Relative: 3 % (ref 0.0–5.0)
HEMATOCRIT: 45.2 % (ref 39.0–52.0)
Hemoglobin: 14.9 g/dL (ref 13.0–17.0)
LYMPHS PCT: 24.4 % (ref 12.0–46.0)
Lymphs Abs: 1.4 10*3/uL (ref 0.7–4.0)
MCHC: 33 g/dL (ref 30.0–36.0)
MCV: 81.7 fl (ref 78.0–100.0)
MONOS PCT: 8 % (ref 3.0–12.0)
Monocytes Absolute: 0.5 10*3/uL (ref 0.1–1.0)
NEUTROS PCT: 63.7 % (ref 43.0–77.0)
Neutro Abs: 3.6 10*3/uL (ref 1.4–7.7)
Platelets: 315 10*3/uL (ref 150.0–400.0)
RBC: 5.53 Mil/uL (ref 4.22–5.81)
RDW: 14.9 % (ref 11.5–15.5)
WBC: 5.7 10*3/uL (ref 4.0–10.5)

## 2017-08-06 LAB — PSA: PSA: 1.24 ng/mL (ref 0.10–4.00)

## 2017-08-06 LAB — SEDIMENTATION RATE: Sed Rate: 18 mm/hr (ref 0–20)

## 2017-08-06 LAB — LIPID PANEL
CHOL/HDL RATIO: 5
Cholesterol: 222 mg/dL — ABNORMAL HIGH (ref 0–200)
HDL: 46.9 mg/dL (ref >39.00)
LDL CALC: 145 mg/dL — AB (ref 0–99)
NONHDL: 175.43
Triglycerides: 154 mg/dL — ABNORMAL HIGH (ref 0.0–149.0)
VLDL: 30.8 mg/dL (ref 0.0–40.0)

## 2017-08-06 LAB — BASIC METABOLIC PANEL
BUN: 11 mg/dL (ref 6–23)
CALCIUM: 10.4 mg/dL (ref 8.4–10.5)
CO2: 31 meq/L (ref 19–32)
CREATININE: 1.26 mg/dL (ref 0.40–1.50)
Chloride: 102 mEq/L (ref 96–112)
GFR: 76.3 mL/min (ref >60.00)
GLUCOSE: 124 mg/dL — AB (ref 70–99)
Potassium: 4.3 mEq/L (ref 3.5–5.1)
Sodium: 142 mEq/L (ref 135–145)

## 2017-08-06 LAB — TSH: TSH: 1.36 u[IU]/mL (ref 0.35–4.50)

## 2017-08-06 LAB — URINALYSIS
BILIRUBIN URINE: NEGATIVE
Ketones, ur: NEGATIVE
LEUKOCYTES UA: NEGATIVE
NITRITE: NEGATIVE
SPECIFIC GRAVITY, URINE: 1.025 (ref 1.000–1.030)
Total Protein, Urine: NEGATIVE
UROBILINOGEN UA: 0.2 (ref 0.0–1.0)
Urine Glucose: NEGATIVE
pH: 6 (ref 5.0–8.0)

## 2017-08-06 LAB — HEPATIC FUNCTION PANEL
ALK PHOS: 47 U/L (ref 39–117)
ALT: 15 U/L (ref 0–53)
AST: 13 U/L (ref 0–37)
Albumin: 4.5 g/dL (ref 3.5–5.2)
BILIRUBIN DIRECT: 0.1 mg/dL (ref 0.0–0.3)
BILIRUBIN TOTAL: 0.6 mg/dL (ref 0.2–1.2)
Total Protein: 8 g/dL (ref 6.0–8.3)

## 2017-08-06 LAB — URIC ACID: Uric Acid, Serum: 8.5 mg/dL — ABNORMAL HIGH (ref 4.0–7.8)

## 2017-08-06 LAB — VITAMIN D 25 HYDROXY (VIT D DEFICIENCY, FRACTURES): VITD: 16.98 ng/mL — AB (ref 30.00–100.00)

## 2017-08-06 LAB — HEMOGLOBIN A1C: HEMOGLOBIN A1C: 6.8 % — AB (ref 4.6–6.5)

## 2017-08-06 MED ORDER — AMLODIPINE BESYLATE 10 MG PO TABS: 10.0000 mg | ORAL_TABLET | Freq: Every day | ORAL | 3 refills | Status: DC

## 2017-08-06 MED ORDER — MELOXICAM 15 MG PO TABS: 15.0000 mg | ORAL_TABLET | Freq: Every day | ORAL | 3 refills | Status: DC

## 2017-08-06 MED ORDER — VITAMIN D3 50 MCG (2000 UT) PO CAPS: 2000.0000 [IU] | ORAL_CAPSULE | Freq: Every day | ORAL | 3 refills | Status: DC

## 2017-08-06 MED FILL — AMLODIPINE BESYLATE 10 MG T: 10 | 90 days supply | Qty: 90 | Fill #3

## 2017-08-06 MED FILL — ROSUVASTATIN CALCIUM 10 MG: 10 | 90 days supply | Qty: 90 | Fill #3

## 2017-08-06 MED FILL — MELOXICAM 15 MG TABLET: 15 | 30 days supply | Qty: 30 | Fill #0

## 2017-08-06 NOTE — Progress Notes (Signed)
Subjective:  Patient ID: Dale Bishop, male    DOB: [birth year]  Age: 57 y.o. MRN: [id number]  CC: No chief complaint on file.   HPI Dale Bishop presents for a well exam C/o LLE "gets tired"- numbness x2 months. No LBP, L buttock hurts Leaning over a cart helps Knees OA - [facility]  Outpatient Medications Prior to Visit  Medication Sig Dispense Refill  . amLODipine (NORVASC) 10 MG tablet TAKE 1 TABLET BY MOUTH ONCE DAILY 90 tablet 3  . atorvastatin (LIPITOR) 40 MG tablet Take 1 tablet (40 mg total) by mouth daily. 90 tablet 1  . carvedilol (COREG) 12.5 MG tablet TAKE 1 TABLET BY MOUTH TWICE A DAY WITH MEALS 180 tablet 3  . cetirizine (ZYRTEC) 10 MG tablet Take 10 mg by mouth daily.    . hydrochlorothiazide (HYDRODIURIL) 25 MG tablet TAKE 1 TABLET BY MOUTH ONCE DAILY 90 tablet 3  . rosuvastatin (CRESTOR) 10 MG tablet TAKE 1 TABLET BY MOUTH ONCE DAILY 90 tablet 3  . triamcinolone cream (KENALOG) 0.1 % APPLY TOPICALLY 2 TIMES DAILY. 30 g 0   No facility-administered medications prior to visit.     ROS Review of Systems  Constitutional: Negative for appetite change, fatigue and unexpected weight change.  HENT: Negative for congestion, nosebleeds, sneezing, sore throat and trouble swallowing.   Eyes: Negative for itching and visual disturbance.  Respiratory: Negative for cough.   Cardiovascular: Negative for chest pain, palpitations and leg swelling.  Gastrointestinal: Negative for abdominal distention, blood in stool, diarrhea and nausea.  Genitourinary: Negative for frequency and hematuria.  Musculoskeletal: Positive for arthralgias and gait problem. Negative for back pain, joint swelling and neck pain.  Skin: Negative for rash.  Neurological: Negative for dizziness, tremors, speech difficulty and weakness.  Psychiatric/Behavioral: Negative for agitation, dysphoric mood and sleep disturbance. The patient is not nervous/anxious.     Objective:  BP 128/82 (BP Location: Left  Arm, Patient Position: Sitting, Cuff Size: Large)   Pulse 63   Temp 97.6 F (36.4 C) (Oral)   Ht 5\' 11"  (1.803 m)   Wt 270 lb (122.5 kg)   SpO2 99%   BMI 37.66 kg/m   BP Readings from Last 3 Encounters:  08/06/17 128/82  08/05/16 120/76  10/04/15 127/90    Wt Readings from Last 3 Encounters:  08/06/17 270 lb (122.5 kg)  08/05/16 264 lb (119.7 kg)  10/04/15 274 lb (124.3 kg)    Physical Exam  Constitutional: He is oriented to person, place, and time. He appears well-developed. No distress.  NAD  HENT:  Mouth/Throat: Oropharynx is clear and moist.  Eyes: Conjunctivae are normal. Pupils are equal, round, and reactive to light.  Neck: Normal range of motion. No JVD present. No thyromegaly present.  Cardiovascular: Normal rate, regular rhythm, normal heart sounds and intact distal pulses. Exam reveals no gallop and no friction rub.  No murmur heard. Pulmonary/Chest: Effort normal and breath sounds normal. No respiratory distress. He has no wheezes. He has no rales. He exhibits no tenderness.  Abdominal: Soft. Bowel sounds are normal. He exhibits no distension and no mass. There is no tenderness. There is no rebound and no guarding.  Musculoskeletal: Normal range of motion. He exhibits tenderness. He exhibits no edema.  Lymphadenopathy:    He has no cervical adenopathy.  Neurological: He is alert and oriented to person, place, and time. He has normal reflexes. No cranial nerve deficit. He exhibits normal muscle tone. He displays a negative Romberg sign. Coordination  and gait normal.  Skin: Skin is warm and dry. No rash noted.  Psychiatric: He has a normal mood and affect. His behavior is normal. Judgment and thought content normal.  L varicous vein on the calf Pt declined rectal L SI very tender Str leg elev, hip - NT B  Lab Results  Component Value Date   WBC 7.3 08/08/2015   HGB 13.9 08/08/2015   HCT 42.0 08/08/2015   PLT 324.0 08/08/2015   GLUCOSE 137 (H) 08/08/2015    CHOL 284 (H) 08/08/2015   TRIG 162.0 (H) 08/08/2015   HDL 49.30 08/08/2015   LDLCALC 202 (H) 08/08/2015   ALT 15 08/08/2015   AST 12 08/08/2015   NA 139 08/08/2015   K 4.0 08/08/2015   CL 101 08/08/2015   CREATININE 1.28 08/08/2015   BUN 14 08/08/2015   CO2 29 08/08/2015   TSH 2.02 08/08/2015   PSA 1.08 08/08/2015    No results found.  Assessment & Plan:   There are no diagnoses linked to this encounter. I am having Dale Bishop maintain his atorvastatin, cetirizine, carvedilol, rosuvastatin, hydrochlorothiazide, amLODipine, and triamcinolone cream.  No orders of the defined types were placed in this encounter.    Follow-up: No Follow-up on file.  Walker Kehr, MD

## 2017-08-06 NOTE — Assessment & Plan Note (Signed)
A1c

## 2017-08-06 NOTE — Assessment & Plan Note (Addendum)
LLE - L SI joint is very painful X ray, Labs Meloxicam Vit d

## 2017-08-06 NOTE — Assessment & Plan Note (Signed)
BP Readings from Last 3 Encounters:  08/06/17 128/82  08/05/16 120/76  10/04/15 127/90

## 2017-08-06 NOTE — Assessment & Plan Note (Signed)
We discussed age appropriate health related issues, including available/recomended screening tests and vaccinations. We discussed a need for adhering to healthy diet and exercise. Labs/EKG were reviewed/ordered. All questions were answered. Colon 4/17 due in 2027

## 2017-08-06 NOTE — Patient Instructions (Signed)
Sacroiliac Joint Dysfunction Sacroiliac joint dysfunction is a condition that causes inflammation on one or both sides of the sacroiliac (SI) joint. The SI joint connects the lower part of the spine (sacrum) with the two upper portions of the pelvis (ilium). This condition causes deep aching or burning pain in the low back. In some cases, the pain may also spread into one or both buttocks or hips or spread down the legs. What are the causes? This condition may be caused by:  Pregnancy. During pregnancy, extra stress is put on the SI joints because the pelvis widens.  Injury, such as: ? Car accidents. ? Sport-related injuries. ? Work-related injuries.  Having one leg that is shorter than the other.  Conditions that affect the joints, such as: ? Rheumatoid arthritis. ? Gout. ? Psoriatic arthritis. ? Joint infection (septic arthritis).  Sometimes, the cause of SI joint dysfunction is not known. What are the signs or symptoms? Symptoms of this condition include:  Aching or burning pain in the lower back. The pain may also spread to other areas, such as: ? Buttocks. ? Groin. ? Thighs and legs.  Muscle spasms in or around the painful areas.  Increased pain when standing, walking, running, stair climbing, bending, or lifting.  How is this diagnosed? Your health care provider will do a physical exam and take your medical history. During the exam, the health care provider may move one or both of your legs to different positions to check for pain. Various tests may be done to help verify the diagnosis, including:  Imaging tests to look for other causes of pain. These may include: ? MRI. ? CT scan. ? Bone scan.  Diagnostic injection. A numbing medicine is injected into the SI joint using a needle. If the pain is temporarily improved or stopped after the injection, this can indicate that SI joint dysfunction is the problem.  How is this treated? Treatment may vary depending on the  cause and severity of your condition. Treatment options may include:  Applying ice or heat to the lower back area. This can help to reduce pain and muscle spasms.  Medicines to relieve pain or inflammation or to relax the muscles.  Wearing a back brace (sacroiliac brace) to help support the joint while your back is healing.  Physical therapy to increase muscle strength around the joint and flexibility at the joint. This may also involve learning proper body positions and ways of moving to relieve stress on the joint.  Direct manipulation of the SI joint.  Injections of steroid medicine into the joint in order to reduce pain and swelling.  Radiofrequency ablation to burn away nerves that are carrying pain messages from the joint.  Use of a device that provides electrical stimulation in order to reduce pain at the joint.  Surgery to put in screws and plates that limit or prevent joint motion. This is rare.  Follow these instructions at home:  Rest as needed. Limit your activities as directed by your health care provider.  Take medicines only as directed by your health care provider.  If directed, apply ice to the affected area: ? Put ice in a plastic bag. ? Place a towel between your skin and the bag. ? Leave the ice on for 20 minutes, 2-3 times per day.  Use a heating pad or a moist heat pack as directed by your health care provider.  Exercise as directed by your health care provider or physical therapist.  Keep all follow-up visits   as directed by your health care provider. This is important. Contact a health care provider if:  Your pain is not controlled with medicine.  You have a fever.  You have increasingly severe pain. Get help right away if:  You have weakness, numbness, or tingling in your legs or feet.  You lose control of your bladder or bowel. This information is not intended to replace advice given to you by your health care provider. Make sure you discuss  any questions you have with your health care provider. Document Released: 08/30/2008 Document Revised: 11/09/2015 Document Reviewed: 02/08/2014 Elsevier Interactive Patient Education  2018 [employer]  

## 2017-08-07 LAB — RHEUMATOID FACTOR: Rhuematoid fact SerPl-aCnc: <14 IU/mL (ref <14)

## 2017-08-07 LAB — HLA-B27 ANTIGEN: HLA-B27 Antigen: NEGATIVE

## 2017-08-10 ENCOUNTER — Other Ambulatory Visit: Payer: Self-pay | Admitting: Internal Medicine

## 2017-08-10 DIAGNOSIS — R7309 Other abnormal glucose: Secondary | ICD-10-CM

## 2017-08-10 MED ORDER — LOSARTAN POTASSIUM 50 MG PO TABS: 50.0000 mg | ORAL_TABLET | Freq: Every day | ORAL | 3 refills | Status: DC

## 2017-08-10 MED ORDER — VITAMIN D3 1.25 MG (50000 UT) PO CAPS: 1.0000 | ORAL_CAPSULE | ORAL | 0 refills | Status: DC

## 2017-08-11 MED FILL — VITAMIN D3 50000 UNIT CAPS: 1.25 MG | 56 days supply | Qty: 8 | Fill #0

## 2017-08-11 MED FILL — LOSARTAN POTASSIUM 50 MG TA: 50 | 90 days supply | Qty: 90 | Fill #0

## 2017-09-08 MED FILL — MELOXICAM 15 MG TABLET: 15 | 30 days supply | Qty: 30 | Fill #1

## 2017-10-23 MED FILL — MELOXICAM 15 MG TABLET: 15 | 30 days supply | Qty: 30 | Fill #2

## 2017-10-30 ENCOUNTER — Other Ambulatory Visit: Payer: Self-pay | Admitting: Internal Medicine

## 2017-10-30 MED FILL — CARVEDILOL 12.5 MG TABS: 12.5 | 90 days supply | Qty: 180 | Fill #0

## 2017-11-04 ENCOUNTER — Ambulatory Visit: Payer: 59 | Admitting: Internal Medicine

## 2017-11-17 ENCOUNTER — Other Ambulatory Visit: Payer: Self-pay | Admitting: Internal Medicine

## 2017-11-17 MED FILL — ROSUVASTATIN CALCIUM 10 MG: 10 | 90 days supply | Qty: 90 | Fill #0

## 2017-11-17 MED FILL — AMLODIPINE BESYLATE 10 MG T: 10 | 90 days supply | Qty: 90 | Fill #0

## 2017-11-17 MED FILL — LOSARTAN POTASSIUM 50 MG TA: 50 | 90 days supply | Qty: 90 | Fill #1

## 2017-11-28 ENCOUNTER — Ambulatory Visit: Payer: 59 | Admitting: Internal Medicine

## 2017-12-10 MED FILL — MELOXICAM 15 MG TABLET: 15 | 30 days supply | Qty: 30 | Fill #3

## 2017-12-11 ENCOUNTER — Ambulatory Visit (INDEPENDENT_AMBULATORY_CARE_PROVIDER_SITE_OTHER): Payer: 59 | Admitting: Internal Medicine

## 2017-12-11 ENCOUNTER — Encounter: Payer: Self-pay | Admitting: Internal Medicine

## 2017-12-11 VITALS — BP 130/82 | HR 58 | Temp 97.8°F | Ht 71.0 in | Wt 254.0 lb

## 2017-12-11 DIAGNOSIS — R29898 Other symptoms and signs involving the musculoskeletal system: Secondary | ICD-10-CM

## 2017-12-11 DIAGNOSIS — M79605 Pain in left leg: Secondary | ICD-10-CM | POA: Diagnosis not present

## 2017-12-11 DIAGNOSIS — I10 Essential (primary) hypertension: Secondary | ICD-10-CM

## 2017-12-11 MED ORDER — TRAMADOL HCL 50 MG PO TABS: 50.0000 mg | ORAL_TABLET | Freq: Four times a day (QID) | ORAL | 0 refills | Status: DC | PRN

## 2017-12-11 MED ORDER — CLINDAMYCIN PHOSPHATE 1 % EX SOLN: Freq: Two times a day (BID) | CUTANEOUS | 3 refills | Status: DC

## 2017-12-11 MED ORDER — PREDNISONE 10 MG PO TABS: ORAL_TABLET | ORAL | 1 refills | Status: DC

## 2017-12-11 MED FILL — traMADol HCL 50 MG TABS: 50 | 5 days supply | Qty: 20 | Fill #0

## 2017-12-11 MED FILL — CLINDAMYCIN PH 1% SOLUTION: 1 | 30 days supply | Qty: 30 | Fill #0

## 2017-12-11 MED FILL — predniSONE 10 MG TABS: 10 | 17 days supply | Qty: 38 | Fill #0

## 2017-12-11 NOTE — Progress Notes (Signed)
Subjective:  Patient ID: Dale Bishop, male    DOB: [birth year]  Age: 57 y.o. MRN: [id number]  CC: No chief complaint on file.   HPI Dale Bishop presents for L leg pain 10/10 at times  Bending the leg would help. C/o leg weakness at times Meloxicam - not helping  Outpatient Medications Prior to Visit  Medication Sig Dispense Refill  . amLODipine (NORVASC) 10 MG tablet Take 1 tablet (10 mg total) by mouth daily. 90 tablet 3  . carvedilol (COREG) 12.5 MG tablet TAKE 1 TABLET BY MOUTH TWICE A DAY WITH MEALS 180 tablet 2  . cetirizine (ZYRTEC) 10 MG tablet Take 10 mg by mouth daily.    . Cholecalciferol (VITAMIN D3) 2000 units capsule Take 1 capsule (2,000 Units total) by mouth daily. 100 capsule 3  . Cholecalciferol (VITAMIN D3) 50000 units CAPS Take 1 capsule by mouth once a week. 8 capsule 0  . losartan (COZAAR) 50 MG tablet Take 1 tablet (50 mg total) by mouth daily. 90 tablet 3  . meloxicam (MOBIC) 15 MG tablet Take 1 tablet (15 mg total) by mouth daily. 30 tablet 3  . rosuvastatin (CRESTOR) 10 MG tablet TAKE 1 TABLET BY MOUTH ONCE DAILY 90 tablet 3  . triamcinolone cream (KENALOG) 0.1 % APPLY TOPICALLY 2 TIMES DAILY. 30 g 0   No facility-administered medications prior to visit.     ROS: Review of Systems  Constitutional: Negative for appetite change, fatigue and unexpected weight change.  HENT: Negative for congestion, nosebleeds, sneezing, sore throat and trouble swallowing.   Eyes: Negative for itching and visual disturbance.  Respiratory: Negative for cough.   Cardiovascular: Negative for chest pain, palpitations and leg swelling.  Gastrointestinal: Negative for abdominal distention, blood in stool, diarrhea and nausea.  Genitourinary: Negative for frequency and hematuria.  Musculoskeletal: Positive for back pain and gait problem. Negative for joint swelling and neck pain.  Skin: Negative for rash.  Neurological: Negative for dizziness, tremors, speech difficulty and  weakness.  Psychiatric/Behavioral: Negative for agitation, dysphoric mood and sleep disturbance. The patient is not nervous/anxious.     Objective:  BP 130/82 (BP Location: Left Arm, Patient Position: Sitting, Cuff Size: Large)   Pulse (!) 58   Temp 97.8 F (36.6 C) (Oral)   Ht 5\' 11"  (1.803 m)   Wt 254 lb (115.2 kg)   SpO2 99%   BMI 35.43 kg/m   BP Readings from Last 3 Encounters:  12/11/17 130/82  08/06/17 128/82  08/05/16 120/76    Wt Readings from Last 3 Encounters:  12/11/17 254 lb (115.2 kg)  08/06/17 270 lb (122.5 kg)  08/05/16 264 lb (119.7 kg)    Physical Exam  Constitutional: He is oriented to person, place, and time. He appears well-developed. No distress.  NAD  HENT:  Mouth/Throat: Oropharynx is clear and moist.  Eyes: Pupils are equal, round, and reactive to light. Conjunctivae are normal.  Neck: Normal range of motion. No JVD present. No thyromegaly present.  Cardiovascular: Normal rate, regular rhythm, normal heart sounds and intact distal pulses. Exam reveals no gallop and no friction rub.  No murmur heard. Pulmonary/Chest: Effort normal and breath sounds normal. No respiratory distress. He has no wheezes. He has no rales. He exhibits no tenderness.  Abdominal: Soft. Bowel sounds are normal. He exhibits no distension and no mass. There is no tenderness. There is no rebound and no guarding.  Musculoskeletal: Normal range of motion. He exhibits tenderness. He exhibits no edema.  Lymphadenopathy:  He has no cervical adenopathy.  Neurological: He is alert and oriented to person, place, and time. He has normal reflexes. No cranial nerve deficit. He exhibits normal muscle tone. He displays a negative Romberg sign. Coordination and gait normal.  Skin: Skin is warm and dry. No rash noted.  Psychiatric: He has a normal mood and affect. His behavior is normal. Judgment and thought content normal.  LS tender on the L str leg (+) L  Lab Results  Component Value  Date   WBC 5.7 08/06/2017   HGB 14.9 08/06/2017   HCT 45.2 08/06/2017   PLT 315.0 08/06/2017   GLUCOSE 124 (H) 08/06/2017   CHOL 222 (H) 08/06/2017   TRIG 154.0 (H) 08/06/2017   HDL 46.90 08/06/2017   LDLCALC 145 (H) 08/06/2017   ALT 15 08/06/2017   AST 13 08/06/2017   NA 142 08/06/2017   K 4.3 08/06/2017   CL 102 08/06/2017   CREATININE 1.26 08/06/2017   BUN 11 08/06/2017   CO2 31 08/06/2017   TSH 1.36 08/06/2017   PSA 1.24 08/06/2017   HGBA1C 6.8 (H) 08/06/2017    Dg Lumbar Spine 2-3 Views  Result Date: 08/06/2017 CLINICAL DATA:  Left lower extremity weakness. EXAM: LUMBAR SPINE - 2-3 VIEW COMPARISON:  None. FINDINGS: Approximately 9 mm anterolisthesis L4-5 with bilateral pars defects of L4. Mild disc space narrowing L4-5. Mild disc degeneration and anterior spurring L1-2 and L2-3. Remaining disc spaces intact. 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 Electronically Signed   By: Franchot Gallo M.D.   On: 08/06/2017 09:52    Assessment & Plan:   There are no diagnoses linked to this encounter.   No orders of the defined types were placed in this encounter.    Follow-up: No follow-ups on file.  Walker Kehr, MD

## 2017-12-14 DIAGNOSIS — M79605 Pain in left leg: Secondary | ICD-10-CM | POA: Insufficient documentation

## 2017-12-14 NOTE — Assessment & Plan Note (Signed)
On Amlodipine, Coreg 

## 2017-12-14 NOTE — Assessment & Plan Note (Addendum)
Worse.  The pain is severe.  Likely sciatica related.  Will obtain LS MRI scan. Tramadol as needed.  Potential benefits of opioids use as well as potential risks (i.e. addiction risk, apnea etc) and complications (i.e. Somnolence, constipation and others) were explained to the patient and were aknowledged. Prednisone 10 mg: take 4 tabs a day x 3 days; then 3 tabs a day x 4 days; then 2 tabs a day x 4 days, then 1 tab a day x 6 days, then stop. Take pc.

## 2017-12-28 ENCOUNTER — Encounter: Payer: Self-pay | Admitting: Emergency Medicine

## 2017-12-28 ENCOUNTER — Emergency Department
Admission: EM | Admit: 2017-12-28 | Discharge: 2017-12-28 | Disposition: A | Discharge status: Home / Self Care | Payer: 59 | Attending: Emergency Medicine | Admitting: Emergency Medicine

## 2017-12-28 ENCOUNTER — Other Ambulatory Visit: Payer: Self-pay

## 2017-12-28 DIAGNOSIS — Y33XXXA Other specified events, undetermined intent, initial encounter: Secondary | ICD-10-CM | POA: Diagnosis not present

## 2017-12-28 DIAGNOSIS — Z79899 Other long term (current) drug therapy: Secondary | ICD-10-CM | POA: Diagnosis not present

## 2017-12-28 DIAGNOSIS — E785 Hyperlipidemia, unspecified: Secondary | ICD-10-CM | POA: Diagnosis not present

## 2017-12-28 DIAGNOSIS — Y929 Unspecified place or not applicable: Secondary | ICD-10-CM | POA: Diagnosis not present

## 2017-12-28 DIAGNOSIS — S8392XA Sprain of unspecified site of left knee, initial encounter: Secondary | ICD-10-CM | POA: Diagnosis not present

## 2017-12-28 DIAGNOSIS — M25462 Effusion, left knee: Secondary | ICD-10-CM | POA: Diagnosis not present

## 2017-12-28 DIAGNOSIS — I10 Essential (primary) hypertension: Secondary | ICD-10-CM | POA: Diagnosis not present

## 2017-12-28 DIAGNOSIS — Y998 Other external cause status: Secondary | ICD-10-CM | POA: Diagnosis not present

## 2017-12-28 DIAGNOSIS — S86912A Strain of unspecified muscle(s) and tendon(s) at lower leg level, left leg, initial encounter: Secondary | ICD-10-CM

## 2017-12-28 DIAGNOSIS — Y939 Activity, unspecified: Secondary | ICD-10-CM | POA: Insufficient documentation

## 2017-12-28 DIAGNOSIS — S8992XA Unspecified injury of left lower leg, initial encounter: Secondary | ICD-10-CM | POA: Diagnosis present

## 2017-12-28 MED ORDER — DICLOFENAC SODIUM 50 MG PO TBEC: 50.0000 mg | DELAYED_RELEASE_TABLET | Freq: Two times a day (BID) | ORAL | 0 refills | Status: AC

## 2017-12-28 NOTE — Discharge Instructions (Signed)
You are being treated for a knee sprain and effusion. Wear the knee brace as needed. Use the crutches to ambulate. Take the antiinflammatory as directed. Rest with the foot elevated and apply ice to reduce swelling.

## 2017-12-28 NOTE — ED Triage Notes (Signed)
States knee "went out" on him on Friday. Took a tramadol last night. No injury

## 2017-12-29 MED FILL — DICLOFENAC SOD EC 50 MG TAB: 50 | 30 days supply | Qty: 60 | Fill #0

## 2018-01-01 ENCOUNTER — Ambulatory Visit
Admission: RE | Admit: 2018-01-01 | Discharge: 2018-01-01 | Disposition: A | Discharge status: Home / Self Care | Payer: 59 | Source: Ambulatory Visit | Attending: Internal Medicine | Admitting: Internal Medicine

## 2018-01-01 DIAGNOSIS — R29898 Other symptoms and signs involving the musculoskeletal system: Secondary | ICD-10-CM

## 2018-01-01 DIAGNOSIS — M48061 Spinal stenosis, lumbar region without neurogenic claudication: Secondary | ICD-10-CM | POA: Diagnosis not present

## 2018-01-01 NOTE — ED Provider Notes (Signed)
[facility] Emergency Department Provider Note ____________________________________________  Time seen: 1410  I have reviewed the triage vital signs and the nursing notes.  HISTORY  Chief Complaint  Knee Pain  HPI Dale Bishop is a 57 y.o. male presents himself to the ED for evaluation of left knee pain. He reports his knee "went out" on him on Friday. He denies an outright fall. He has a history of OA, and recently had x-rays performed by his ortho provider. He reports stiffness and posterior pain. He also complains of fullness and pain with attempts to bear weight. He denies any catching, clicking, or locking. He denies any measurable benefit with his daily Mobic or intermittent Tramadol.  Past Medical History:  Diagnosis Date  . Hyperlipidemia   . Hypertension     Patient Active Problem List   Diagnosis Date Noted  . Leg pain, lateral, left 12/14/2017  . Leg weakness 08/06/2017  . Knee osteoarthritis 08/05/2016  . Elevated glucose 08/05/2016  . Vitamin D deficiency 08/05/2016  . CTS (carpal tunnel syndrome) 01/08/2015  . Well adult exam 08/01/2014  . Non-suppurative otitis media 08/01/2014  . Nausea without vomiting 04/21/2014  . Allergic rhinitis 04/21/2014  . Neoplasm of uncertain behavior of skin 02/28/2014  . Dyslipidemia 10/22/2013  . Essential hypertension 09/01/2013  . Eczema 09/01/2013    Past Surgical History:  Procedure Laterality Date  . NO PAST SURGERIES      Prior to Admission medications   Medication Sig Start Date End Date Taking? Authorizing Provider  amLODipine (NORVASC) 10 MG tablet Take 1 tablet (10 mg total) by mouth daily. 08/06/17   Plotnikov, Evie Lacks, MD  carvedilol (COREG) 12.5 MG tablet TAKE 1 TABLET BY MOUTH TWICE A DAY WITH MEALS 10/30/17   Hoyt Koch, MD  cetirizine (ZYRTEC) 10 MG tablet Take 10 mg by mouth daily.    [provider]  Cholecalciferol (VITAMIN D3) 2000 units capsule Take 1 capsule  (2,000 Units total) by mouth daily. 08/06/17   Plotnikov, Evie Lacks, MD  Cholecalciferol (VITAMIN D3) 50000 units CAPS Take 1 capsule by mouth once a week. 08/10/17   Plotnikov, Evie Lacks, MD  clindamycin (CLEOCIN T) 1 % external solution Apply topically 2 (two) times daily. Apply to rash 12/11/17   Plotnikov, Evie Lacks, MD  diclofenac (VOLTAREN) 50 MG EC tablet Take 1 tablet (50 mg total) by mouth 2 (two) times daily. 12/28/17 01/27/18  Riggins Cisek, Dannielle Karvonen, PA-C  losartan (COZAAR) 50 MG tablet Take 1 tablet (50 mg total) by mouth daily. 08/10/17   Plotnikov, Evie Lacks, MD  meloxicam (MOBIC) 15 MG tablet Take 1 tablet (15 mg total) by mouth daily. 08/06/17   Plotnikov, Evie Lacks, MD  predniSONE (DELTASONE) 10 MG tablet Prednisone 10 mg: take 4 tabs a day x 3 days; then 3 tabs a day x 4 days; then 2 tabs a day x 4 days, then 1 tab a day x 6 days, then stop. Take pc. 12/11/17   Plotnikov, Evie Lacks, MD  rosuvastatin (CRESTOR) 10 MG tablet TAKE 1 TABLET BY MOUTH ONCE DAILY 11/17/17   Plotnikov, Evie Lacks, MD  traMADol (ULTRAM) 50 MG tablet Take 1 tablet (50 mg total) by mouth every 6 (six) hours as needed for severe pain. 12/11/17   Plotnikov, Evie Lacks, MD  triamcinolone cream (KENALOG) 0.1 % APPLY TOPICALLY 2 TIMES DAILY. 03/21/17   Plotnikov, Evie Lacks, MD    Allergies Avapro [irbesartan]; Maxzide [hydrochlorothiazide w-triamterene]; and Penicillins  Family History  Problem Relation Age of Onset  . Colon cancer Neg Hx     Social History Social History   Tobacco Use  . Smoking status: Never Smoker  . Smokeless tobacco: Never Used  Substance Use Topics  . Alcohol use: No    Alcohol/week: 0.0 oz  . Drug use: No    Review of Systems  Constitutional: Negative for fever. Cardiovascular: Negative for chest pain. Respiratory: Negative for shortness of breath. Musculoskeletal: Negative for back pain. Left knee pain Skin: Negative for rash. Neurological: Negative for headaches, focal weakness or  numbness. ____________________________________________  PHYSICAL EXAM:  VITAL SIGNS: ED Triage Vitals  Enc Vitals Group     BP 12/28/17 1309 130/82     Pulse Rate 12/28/17 1309 (!) 59     Resp 12/28/17 1309 16     Temp 12/28/17 1309 97.8 F (36.6 C)     Temp Source 12/28/17 1309 Oral     SpO2 12/28/17 1309 96 %     Weight 12/28/17 1310 250 lb (113.4 kg)     Height 12/28/17 1310 5\' 11"  (1.803 m)     Head Circumference --      Peak Flow --      Pain Score 12/28/17 1319 10     Pain Loc --      Pain Edu? --      Excl. in [place]? --     Constitutional: Alert and oriented. Well appearing and in no distress. Head: Normocephalic and atraumatic. Eyes: Conjunctivae are normal. Normal extraocular movements Cardiovascular: Normal rate, regular rhythm. Normal distal pulses. Respiratory: Normal respiratory effort.  Musculoskeletal: Left knee without obvious deformity or dislocation. A small effusion is noted. Normal ROM without laxity or internal derangement. No popliteal space fullness or calf or achilles tenderness. Nontender with normal range of motion in all other extremities.  Neurologic:  Normal gait without ataxia. Normal speech and language. No gross focal neurologic deficits are appreciated. Skin:  Skin is warm, dry and intact. No rash  Noted. ___________________________________________  RADIOLOGY Deferred ____________________________________________  PROCEDURES  Procedures Knee immobilizer Crutches ____________________________________________  INITIAL IMPRESSION / ASSESSMENT AND PLAN / ED COURSE  Patient with ED evaluation of left knee sprain and effusion. No evidence of internal derangement. He has deferred repeat X-rays for his chronic DJD. He will follow-up with his ortho provider as planned. He is pleased to trial Diclofenac for joint pain. He will ambulate with crutches and a knee immobilizer. ____________________________________________  FINAL CLINICAL IMPRESSION(S)  / ED DIAGNOSES  Final diagnoses:  Effusion of left knee  Strain of left knee, initial encounter     Melvenia Needles, PA-C 01/01/18 [place], [place], MD 01/01/18 2225

## 2018-01-08 ENCOUNTER — Ambulatory Visit (INDEPENDENT_AMBULATORY_CARE_PROVIDER_SITE_OTHER): Payer: 59 | Admitting: Family Medicine

## 2018-01-08 ENCOUNTER — Encounter: Payer: Self-pay | Admitting: Family Medicine

## 2018-01-08 ENCOUNTER — Ambulatory Visit (INDEPENDENT_AMBULATORY_CARE_PROVIDER_SITE_OTHER)
Admission: RE | Admit: 2018-01-08 | Discharge: 2018-01-08 | Disposition: A | Discharge status: Home / Self Care | Payer: 59 | Source: Ambulatory Visit | Attending: Family Medicine | Admitting: Family Medicine

## 2018-01-08 VITALS — BP 140/80 | HR 73 | Ht 71.0 in | Wt 255.0 lb

## 2018-01-08 DIAGNOSIS — M1712 Unilateral primary osteoarthritis, left knee: Secondary | ICD-10-CM | POA: Diagnosis not present

## 2018-01-08 DIAGNOSIS — M25562 Pain in left knee: Secondary | ICD-10-CM

## 2018-01-08 MED ORDER — TRIAMCINOLONE ACETONIDE 40 MG/ML IJ SUSP
40.0000 mg | Freq: Once | INTRAMUSCULAR | Status: AC
  Administered 2018-01-08: 40 mg via INTRA_ARTICULAR

## 2018-01-08 NOTE — Assessment & Plan Note (Signed)
Pain likely component of arthritis and likely degenerative meniscal. No significant effusion today but has had his knee wrapped.  - left knee injection  - can continue diclofenac  - provided hinged knee brace - counseled on icing - xrays  - if no improvement consider PT

## 2018-01-08 NOTE — Patient Instructions (Signed)
Good to see you  Please continue ice the knee  I will call you with the results from today  Please continue the diclofenac  Please follow up with me if your pain doesn't improve.

## 2018-01-08 NOTE — Progress Notes (Signed)
Dale Bishop - 57 y.o. male MRN [id number]  Date of birth: 1960-10-29  SUBJECTIVE:  Including CC & ROS.  No chief complaint on file.   Dale Bishop is a 57 y.o. male that is presenting with left knee pain.  About a week ago he was walking and felt a sudden sharp pain in his knee.  It is occurring on the medial aspect.  This is different than the other fatigue and pain he is experiencing as to why he received an MRI.  The pain is significant in nature.  There is localized to the knee.  He has had swelling and has been using an Ace wrap on his knee.  He has been taken diclofenac after he was evaluated in the emergency department x-rays were not obtained at that time.  He is also been using crutches.  He denies any locking or giving way.  The pain is significantly worse upon standing and ambulating..  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   Review of Systems  Constitutional: Negative for fever.  HENT: Negative for congestion.   Respiratory: Negative for cough.   Cardiovascular: Negative for chest pain.  Gastrointestinal: Negative for abdominal pain.  Musculoskeletal: Positive for arthralgias and gait problem.  Skin: Negative for color change.  Neurological: Negative for weakness.  Hematological: Negative for adenopathy.  Psychiatric/Behavioral: Negative for agitation.    HISTORY: Past Medical, Surgical, Social, and Family History Reviewed & Updated per EMR.   Pertinent Historical Findings include:  Past Medical History:  Diagnosis Date  . Hyperlipidemia   . Hypertension     Past Surgical History:  Procedure Laterality Date  . NO PAST SURGERIES      Allergies  Allergen Reactions  . Avapro [Irbesartan]     nausea  . Maxzide [Hydrochlorothiazide  W-Triamterene]     Nausea. Pt can take HCTZ  . Penicillins Other (See Comments)    Per pt: unknown    Family History  Problem Relation Age of Onset  . Colon cancer Neg Hx      Social History   Socioeconomic History  . Marital status: Married    Spouse name: Not on file  . Number of children: Not on file  . Years of education: Not on file  . Highest education level: Not on file  Occupational History  . Not on file  Social Needs  . Financial resource strain: Not on file  . Food insecurity:    Worry: Not on file    Inability: Not on file  . Transportation needs:    Medical: Not on file    Non-medical: Not on file  Tobacco Use  . Smoking status: Never Smoker  . Smokeless tobacco: Never Used  Substance and Sexual Activity  . Alcohol use: No    Alcohol/week: 0.0 oz  . Drug use: No  . Sexual activity: Yes  Lifestyle  . Physical activity:    Days per week: Not on file    Minutes per session: Not on file  . Stress: Not on file  Relationships  . Social connections:    Talks on phone: Not on file    Gets together: Not on file    Attends religious service: Not on file    Active member of club or organization: Not on  file    Attends meetings of clubs or organizations: Not on file    Relationship status: Not on file  . Intimate partner violence:    Fear of current or ex partner: Not on file    Emotionally abused: Not on file    Physically abused: Not on file    Forced sexual activity: Not on file  Other Topics Concern  . Not on file  Social History Narrative  . Not on file     PHYSICAL EXAM:  VS: BP 140/80 (BP Location: Left Arm, Patient Position: Sitting, Cuff Size: Large)   Pulse 73   Ht 5\' 11"  (1.803 m)   Wt 255 lb (115.7 kg)   SpO2 97%   BMI 35.57 kg/m  Physical Exam Gen: NAD, alert, cooperative with exam, well-appearing ENT: normal lips, normal nasal mucosa,  Eye: normal EOM, normal conjunctiva and lids CV:  no edema, +2 pedal pulses   Resp: no  accessory muscle use, non-labored,  GI: no masses or tenderness, no hernia  Skin: no rashes, no areas of induration  Neuro: normal tone, normal sensation to touch Psych:  normal insight, alert and oriented MSK:  Left Knee: Normal to inspection with no erythema or obvious bony abnormalities. Mild effusion Palpation normal with no warmth or condyle tenderness. TTP of the medial joint lines ROM full in flexion and extension and lower leg rotation. Ligaments with solid consistent endpoints including LCL, MCL. Pain with Thessalonian tests. Some pain with patellar compression. Patellar and quadriceps tendons unremarkable. Hamstring and quadriceps strength is normal.  Neurovascularly intact   Limited ultrasound: Left knee:  Mild effusion in SPP  Normal appearing QT and PT  Normal appearing lateral meniscus Joint space narrowing of the medial joint line.   Summary: degenerative medial joint space narrowing   Ultrasound and interpretation by Clearance Coots, MD         Aspiration/Injection Procedure Note Charlis Bishop [birth year]  Procedure: Injection Indications: left knee pain   Procedure Details Consent: Risks of procedure as well as the alternatives and risks of each were explained to the (patient/caregiver).  Consent for procedure obtained. Time Out: Verified patient identification, verified procedure, site/side was marked, verified correct patient position, special equipment/implants available, medications/allergies/relevent history reviewed, required imaging and test results available.  Performed.  The area was cleaned with iodine and alcohol swabs.    The left knee superiorlateral SPP was injected using 1 cc's of 40 mg kenalog and 4 cc's of 0.5% bupivacaine with a 22 1 1/2" needle.  Ultrasound was used. Images were obtained in  Long views showing the injection.    A sterile dressing was applied.  Patient did tolerate procedure well.          ASSESSMENT & PLAN:    Acute pain of left knee Pain likely component of arthritis and likely degenerative meniscal. No significant effusion today but has had his knee wrapped.  - left knee injection  - can continue diclofenac  - provided hinged knee brace - counseled on icing - xrays  - if no improvement consider PT

## 2018-01-09 ENCOUNTER — Telehealth: Payer: Self-pay | Admitting: Family Medicine

## 2018-01-09 NOTE — Telephone Encounter (Signed)
Faxed request for medical records to [facility] [phone] 01/09/18 LM

## 2018-01-14 ENCOUNTER — Ambulatory Visit (INDEPENDENT_AMBULATORY_CARE_PROVIDER_SITE_OTHER): Payer: 59 | Admitting: Internal Medicine

## 2018-01-14 ENCOUNTER — Other Ambulatory Visit (INDEPENDENT_AMBULATORY_CARE_PROVIDER_SITE_OTHER): Payer: 59

## 2018-01-14 ENCOUNTER — Encounter: Payer: Self-pay | Admitting: Internal Medicine

## 2018-01-14 DIAGNOSIS — R7309 Other abnormal glucose: Secondary | ICD-10-CM

## 2018-01-14 DIAGNOSIS — M25562 Pain in left knee: Secondary | ICD-10-CM

## 2018-01-14 DIAGNOSIS — E559 Vitamin D deficiency, unspecified: Secondary | ICD-10-CM | POA: Diagnosis not present

## 2018-01-14 DIAGNOSIS — R29898 Other symptoms and signs involving the musculoskeletal system: Secondary | ICD-10-CM | POA: Diagnosis not present

## 2018-01-14 DIAGNOSIS — M79605 Pain in left leg: Secondary | ICD-10-CM | POA: Diagnosis not present

## 2018-01-14 LAB — BASIC METABOLIC PANEL
BUN: 18 mg/dL (ref 6–23)
CALCIUM: 9.9 mg/dL (ref 8.4–10.5)
CO2: 30 meq/L (ref 19–32)
Chloride: 105 mEq/L (ref 96–112)
Creatinine, Ser: 1.24 mg/dL (ref 0.40–1.50)
GFR: 77.6 mL/min (ref >60.00)
GLUCOSE: 96 mg/dL (ref 70–99)
POTASSIUM: 4.4 meq/L (ref 3.5–5.1)
SODIUM: 140 meq/L (ref 135–145)

## 2018-01-14 LAB — URIC ACID: URIC ACID, SERUM: 5.2 mg/dL (ref 4.0–7.8)

## 2018-01-14 LAB — HEMOGLOBIN A1C: Hgb A1c MFr Bld: 6.5 % (ref 4.6–6.5)

## 2018-01-14 MED ORDER — CLINDAMYCIN PHOSPHATE 1 % EX SOLN: Freq: Two times a day (BID) | CUTANEOUS | 3 refills | Status: DC

## 2018-01-14 MED ORDER — TRAMADOL HCL 50 MG PO TABS: 50.0000 mg | ORAL_TABLET | Freq: Four times a day (QID) | ORAL | 0 refills | Status: DC | PRN

## 2018-01-14 MED ORDER — GABAPENTIN 100 MG PO CAPS
100.0000 mg | ORAL_CAPSULE | Freq: Every evening | ORAL | 3 refills | Status: DC | PRN
Start: 2018-01-14 — End: 2019-06-08

## 2018-01-14 MED FILL — traMADol HCL 50 MG TABS: 50 | 15 days supply | Qty: 60 | Fill #0

## 2018-01-14 MED FILL — GABAPENTIN 100 MG CAPSULE: 100 | 30 days supply | Qty: 90 | Fill #0

## 2018-01-14 MED FILL — CLINDAMYCIN PH 1% SOLUTION: 1 | 10 days supply | Qty: 30 | Fill #0

## 2018-01-14 NOTE — Assessment & Plan Note (Signed)
In a brace

## 2018-01-14 NOTE — Assessment & Plan Note (Signed)
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 LEFT L4 neural impingement is likely contributory to the patient's LEFT leg symptomatology.  NS referal

## 2018-01-14 NOTE — Progress Notes (Signed)
Subjective:  Patient ID: Dale Bishop, male    DOB: 09-20-1960  Age: 57 y.o. MRN: [id number]  CC: No chief complaint on file.   HPI Dale Bishop presents for L lumbar radiculopathy - Prednisone helped; and a recent L knee injury. The pt saw Dr Raeford Razor and had an MRI  Outpatient Medications Prior to Visit  Medication Sig Dispense Refill  . amLODipine (NORVASC) 10 MG tablet Take 1 tablet (10 mg total) by mouth daily. 90 tablet 3  . carvedilol (COREG) 12.5 MG tablet TAKE 1 TABLET BY MOUTH TWICE A DAY WITH MEALS 180 tablet 2  . cetirizine (ZYRTEC) 10 MG tablet Take 10 mg by mouth daily.    . Cholecalciferol (VITAMIN D3) 2000 units capsule Take 1 capsule (2,000 Units total) by mouth daily. 100 capsule 3  . Cholecalciferol (VITAMIN D3) 50000 units CAPS Take 1 capsule by mouth once a week. 8 capsule 0  . clindamycin (CLEOCIN T) 1 % external solution Apply topically 2 (two) times daily. Apply to rash 30 mL 3  . diclofenac (VOLTAREN) 50 MG EC tablet Take 1 tablet (50 mg total) by mouth 2 (two) times daily. 60 tablet 0  . losartan (COZAAR) 50 MG tablet Take 1 tablet (50 mg total) by mouth daily. 90 tablet 3  . rosuvastatin (CRESTOR) 10 MG tablet TAKE 1 TABLET BY MOUTH ONCE DAILY 90 tablet 3  . traMADol (ULTRAM) 50 MG tablet Take 1 tablet (50 mg total) by mouth every 6 (six) hours as needed for severe pain. 20 tablet 0  . triamcinolone cream (KENALOG) 0.1 % APPLY TOPICALLY 2 TIMES DAILY. 30 g 0   No facility-administered medications prior to visit.     ROS: Review of Systems  Constitutional: Negative for appetite change, fatigue and unexpected weight change.  HENT: Negative for congestion, nosebleeds, sneezing, sore throat and trouble swallowing.   Eyes: Negative for itching and visual disturbance.  Respiratory: Negative for cough.   Cardiovascular: Negative for chest pain, palpitations and leg swelling.  Gastrointestinal: Negative for abdominal distention, blood in stool, diarrhea and  nausea.  Genitourinary: Negative for frequency and hematuria.  Musculoskeletal: Positive for arthralgias, back pain and gait problem. Negative for joint swelling, neck pain and neck stiffness.  Skin: Negative for rash.  Neurological: Negative for dizziness, tremors, speech difficulty and weakness.  Psychiatric/Behavioral: Negative for agitation, dysphoric mood and sleep disturbance. The patient is not nervous/anxious.     Objective:  BP 126/84 (BP Location: Left Arm, Patient Position: Sitting, Cuff Size: Large)   Pulse 62   Temp 98 F (36.7 C) (Oral)   Ht 5\' 11"  (1.803 m)   Wt 255 lb (115.7 kg)   SpO2 98%   BMI 35.57 kg/m   BP Readings from Last 3 Encounters:  01/14/18 126/84  01/08/18 140/80  12/28/17 130/82    Wt Readings from Last 3 Encounters:  01/14/18 255 lb (115.7 kg)  01/08/18 255 lb (115.7 kg)  12/28/17 250 lb (113.4 kg)    Physical Exam  Constitutional: He is oriented to person, place, and time. He appears well-developed. No distress.  NAD  HENT:  Mouth/Throat: Oropharynx is clear and moist.  Eyes: Pupils are equal, round, and reactive to light. Conjunctivae are normal.  Neck: Normal range of motion. No JVD present. No thyromegaly present.  Cardiovascular: Normal rate, regular rhythm, normal heart sounds and intact distal pulses. Exam reveals no gallop and no friction rub.  No murmur heard. Pulmonary/Chest: Effort normal and breath sounds normal. No respiratory  distress. He has no wheezes. He has no rales. He exhibits no tenderness.  Abdominal: Soft. Bowel sounds are normal. He exhibits no distension and no mass. There is no tenderness. There is no rebound and no guarding.  Musculoskeletal: Normal range of motion. He exhibits tenderness. He exhibits no edema.  Lymphadenopathy:    He has no cervical adenopathy.  Neurological: He is alert and oriented to person, place, and time. He has normal reflexes. No cranial nerve deficit. He exhibits normal muscle tone. He  displays a negative Romberg sign. Coordination abnormal. Gait normal.  Skin: Skin is warm and dry. No rash noted.  Psychiatric: He has a normal mood and affect. His behavior is normal. Judgment and thought content normal.    Lab Results  Component Value Date   WBC 5.7 08/06/2017   HGB 14.9 08/06/2017   HCT 45.2 08/06/2017   PLT 315.0 08/06/2017   GLUCOSE 124 (H) 08/06/2017   CHOL 222 (H) 08/06/2017   TRIG 154.0 (H) 08/06/2017   HDL 46.90 08/06/2017   LDLCALC 145 (H) 08/06/2017   ALT 15 08/06/2017   AST 13 08/06/2017   NA 142 08/06/2017   K 4.3 08/06/2017   CL 102 08/06/2017   CREATININE 1.26 08/06/2017   BUN 11 08/06/2017   CO2 31 08/06/2017   TSH 1.36 08/06/2017   PSA 1.24 08/06/2017   HGBA1C 6.8 (H) 08/06/2017    Dg Knee Complete 4 Views Left  Result Date: 01/08/2018 CLINICAL DATA:  Ten days of medial knee pain without known injury. EXAM: LEFT KNEE - COMPLETE 4+ VIEW COMPARISON:  None in PACs FINDINGS: The bones are subjectively adequately mineralized. There is no acute or healing fracture. The joint spaces are reasonably well-maintained. There is beaking of the tibial spines. Small spurs arise from the articular margins of the medial femoral condyle and medial tibial plateau. There spurs arising from the articular margins of the patella. There is no joint effusion. IMPRESSION: There is no acute bony abnormality of the left knee. 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 Electronically Signed   By: David  Martinique M.D.   On: 01/08/2018 11:07    Assessment & Plan:   There are no diagnoses linked to this encounter.   No orders of the defined types were placed in this encounter.    Follow-up: No follow-ups on file.  Walker Kehr, MD

## 2018-01-14 NOTE — Assessment & Plan Note (Signed)
Vit D 

## 2018-02-06 ENCOUNTER — Ambulatory Visit: Payer: 59 | Admitting: Family Medicine

## 2018-02-11 NOTE — Progress Notes (Signed)
Dale Bishop - 57 y.o. male MRN [id number]  Date of birth: 1960/10/06  SUBJECTIVE:  Including CC & ROS.  Chief Complaint  Patient presents with  . Knee Pain     Dale Bishop is a 57 y.o. male that is here today for left knee pain follow up and new onset right knee pain.  He has ongoing knee pain bilateral. He received an injection into his left knee on 01/08/18 which did not improve his pain. He feels like his pain has worsened in his left knee and started in his right knee as well. painis severe and is affecting his sleep.  He wore the brace but not consistently.Admits to difficulty slepeing due to the pain.   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  Left L4 neural impingement.   Review of Systems  Constitutional: Negative for fever.  HENT: Negative for congestion.   Respiratory: Negative for cough.   Cardiovascular: Negative for chest pain.  Gastrointestinal: Negative for abdominal pain.  Musculoskeletal: Positive for gait problem.  Skin: Negative for color change.  Neurological: Positive for weakness.  Hematological: Negative for adenopathy.    HISTORY: Past Medical, Surgical, Social, and Family History Reviewed & Updated per EMR.   Pertinent Historical Findings include:  Past Medical History:  Diagnosis Date  . Hyperlipidemia   . Hypertension     Past Surgical History:  Procedure Laterality Date  . NO PAST SURGERIES      Allergies  Allergen Reactions  . Avapro [Irbesartan]     nausea  . Maxzide [Hydrochlorothiazide W-Triamterene]     Nausea. Pt can take HCTZ  . Penicillins Other (See Comments)    Per pt: unknown    Family History  Problem Relation Age of Onset  .  Colon cancer Neg Hx      Social History   Socioeconomic History  . Marital status: Married    Spouse name: Not on file  . Number of children: Not on file  . Years of education: Not on file  . Highest education level: Not on file  Occupational History  . Not on file  Social Needs  . Financial resource strain: Not on file  . Food insecurity:    Worry: Not on file    Inability: Not on file  . Transportation needs:    Medical: Not on file    Non-medical: Not on file  Tobacco Use  . Smoking status: Never Smoker  . Smokeless tobacco: Never Used  Substance and Sexual Activity  . Alcohol use: No    Alcohol/week: 0.0 standard drinks  . Drug use: No  . Sexual activity: Yes  Lifestyle  . Physical activity:    Days per week: Not on file    Minutes per session: Not on file  . Stress: Not on file  Relationships  . Social connections:    Talks on phone: Not on file    Gets together: Not on file    Attends religious service: Not on file    Active member of club or organization: Not on file    Attends meetings of clubs or organizations: Not on file    Relationship status: Not on file  . Intimate partner violence:  Fear of current or ex partner: Not on file    Emotionally abused: Not on file    Physically abused: Not on file    Forced sexual activity: Not on file  Other Topics Concern  . Not on file  Social History Narrative  . Not on file     PHYSICAL EXAM:  VS: BP (!) 148/86 (BP Location: Left Arm, Patient Position: Sitting, Cuff Size: Normal)   Pulse 86   Ht 5\' 11"  (1.803 m)   Wt 254 lb (115.2 kg)   SpO2 98%   BMI 35.43 kg/m  Physical Exam Gen: NAD, alert, cooperative with exam, well-appearing ENT: normal lips, normal nasal mucosa,  Eye: normal EOM, normal conjunctiva and lids CV:  no edema, +2 pedal pulses   Resp: no accessory muscle use, non-labored,  GI: no masses or tenderness, no hernia  Skin: no rashes, no areas of induration  Neuro: normal tone, normal  sensation to touch Psych:  normal insight, alert and oriented MSK:  Right knee: No obvious effusion. Tenderness palpation of the medial joint line. Normal knee flexion extension. Some pain with patellar grind compression. Negative McMurray's test. Neurovascularly intact  Limited ultrasound: Right knee:  No obvious effusion. Degenerative changes of the medial joint line. Normal-appearing quadricep and patellar tendon  Summary: Mild degenerative joint space changes.  Ultrasound and interpretation by Clearance Coots, MD      ASSESSMENT & PLAN:   Acute pain of left knee Pain is ongoing and did not have improvement with the previous injection.  Possible that his pain is related to his back with findings on the most recent MRI lumbar spine. -Toradol injection today. -If no improvement may need to consider physical therapy  Acute pain of right knee Possible to have a flare of underlying arthritis due to compensating from the left side.  Possibly related to the back with the findings observed on MRI.  Ultrasound was revealing for mild degenerative changes. -Toradol today. -If no improvement consider knee injection. -Counseled on supportive care

## 2018-02-12 ENCOUNTER — Ambulatory Visit (INDEPENDENT_AMBULATORY_CARE_PROVIDER_SITE_OTHER): Payer: 59 | Admitting: Family Medicine

## 2018-02-12 ENCOUNTER — Encounter: Payer: Self-pay | Admitting: Family Medicine

## 2018-02-12 VITALS — BP 148/86 | HR 86 | Ht 71.0 in | Wt 254.0 lb

## 2018-02-12 DIAGNOSIS — M25561 Pain in right knee: Secondary | ICD-10-CM | POA: Insufficient documentation

## 2018-02-12 DIAGNOSIS — M25562 Pain in left knee: Secondary | ICD-10-CM | POA: Diagnosis not present

## 2018-02-12 MED ORDER — KETOROLAC TROMETHAMINE 60 MG/2ML IM SOLN
60.0000 mg | Freq: Once | INTRAMUSCULAR | Status: AC
  Administered 2018-02-12: 60 mg via INTRAMUSCULAR

## 2018-02-12 NOTE — Assessment & Plan Note (Signed)
Pain is ongoing and did not have improvement with the previous injection.  Possible that his pain is related to his back with findings on the most recent MRI lumbar spine. -Toradol injection today. -If no improvement may need to consider physical therapy

## 2018-02-12 NOTE — Patient Instructions (Signed)
Good to see you  Take tylenol 650 mg three times a day is the best evidence based medicine we have for arthritis.  Glucosamine sulfate 750mg  twice a day is a supplement that has been shown to help moderate to severe arthritis. Vitamin D 2000 IU daily Fish oil 2 grams daily.  Tumeric 500mg  twice daily.  Capsaicin topically up to four times a day may also help with pain. Please take an anti-inflammatory on a scheduled basis.  Please follow up with me to have a right knee injection if your pain doesn't improve.

## 2018-02-12 NOTE — Assessment & Plan Note (Signed)
Possible to have a flare of underlying arthritis due to compensating from the left side.  Possibly related to the back with the findings observed on MRI.  Ultrasound was revealing for mild degenerative changes. -Toradol today. -If no improvement consider knee injection. -Counseled on supportive care

## 2018-02-19 ENCOUNTER — Ambulatory Visit: Payer: 59 | Admitting: Family Medicine

## 2018-02-20 ENCOUNTER — Encounter: Payer: Self-pay | Admitting: Family Medicine

## 2018-02-20 ENCOUNTER — Ambulatory Visit: Payer: 59 | Admitting: Family Medicine

## 2018-02-20 DIAGNOSIS — M170 Bilateral primary osteoarthritis of knee: Secondary | ICD-10-CM

## 2018-02-20 MED ORDER — DICLOFENAC SODIUM 2 % TD SOLN: 1.0000 "application " | Freq: Two times a day (BID) | TRANSDERMAL | 3 refills | Status: DC

## 2018-02-20 NOTE — Assessment & Plan Note (Signed)
Right knee is acute on chronic in nature. Left knee with injection on 7/25 - injection today  - pennsaid for the left knee  - counseled on supportive care - if no improvement consider PT

## 2018-02-20 NOTE — Patient Instructions (Signed)
Good to see you  Please try ice on the knees  Please try compression  Please follow up if your knees don't improve

## 2018-02-20 NOTE — Progress Notes (Signed)
Dale Bishop - 57 y.o. male MRN [id number]  Date of birth: 24-Oct-1960  SUBJECTIVE:  Including CC & ROS.  Chief Complaint  Patient presents with  . Follow-up    Dale Bishop is a 57 y.o. male that is here today for bilateral knee follow up. Pain has not improved, feels like his pain is worse. Pain is constant in nature. Pain is affecting his sleep. Mild to severe during ambulation. He received an injection into his left knee on 7/25 which didn't result in improvement in his pain. The pain is severe. The pain seems to be localized to the knee. He has pain worse with walking and getting up from a seated position.    Review of Systems  Constitutional: Negative for fever.  HENT: Negative for congestion.   Respiratory: Negative for cough.   Cardiovascular: Negative for chest pain.  Gastrointestinal: Negative for abdominal pain.  Musculoskeletal: Positive for joint swelling.  Skin: Negative for color change.  Neurological: Negative for weakness.  Hematological: Negative for adenopathy.  Psychiatric/Behavioral: Negative for agitation.    HISTORY: Past Medical, Surgical, Social, and Family History Reviewed & Updated per EMR.   Pertinent Historical Findings include:  Past Medical History:  Diagnosis Date  . Hyperlipidemia   . Hypertension     Past Surgical History:  Procedure Laterality Date  . NO PAST SURGERIES      Allergies  Allergen Reactions  . Avapro [Irbesartan]     nausea  . Maxzide [Hydrochlorothiazide W-Triamterene]     Nausea. Pt can take HCTZ  . Penicillins Other (See Comments)    Per pt: unknown    Family History  Problem Relation Age of Onset  . Colon cancer Neg Hx      Social History   Socioeconomic History  . Marital status: Married    Spouse name: Not on file  . Number of children: Not on file  . Years of education: Not on file  . Highest education level: Not on file  Occupational History  . Not on file  Social Needs  . Financial resource  strain: Not on file  . Food insecurity:    Worry: Not on file    Inability: Not on file  . Transportation needs:    Medical: Not on file    Non-medical: Not on file  Tobacco Use  . Smoking status: Never Smoker  . Smokeless tobacco: Never Used  Substance and Sexual Activity  . Alcohol use: No    Alcohol/week: 0.0 standard drinks  . Drug use: No  . Sexual activity: Yes  Lifestyle  . Physical activity:    Days per week: Not on file    Minutes per session: Not on file  . Stress: Not on file  Relationships  . Social connections:    Talks on phone: Not on file    Gets together: Not on file    Attends religious service: Not on file    Active member of club or organization: Not on file    Attends meetings of clubs or organizations: Not on file    Relationship status: Not on file  . Intimate partner violence:    Fear of current or ex partner: Not on file    Emotionally abused: Not on file    Physically abused: Not on file    Forced sexual activity: Not on file  Other Topics Concern  . Not on file  Social History Narrative  . Not on file     PHYSICAL EXAM:  VS: BP 136/74   Pulse 98   Ht 5\' 11"  (1.803 m)   Wt 253 lb (114.8 kg)   SpO2 98%   BMI 35.29 kg/m  Physical Exam Gen: NAD, alert, cooperative with exam, well-appearing ENT: normal lips, normal nasal mucosa,  Eye: normal EOM, normal conjunctiva and lids CV:  no edema, +2 pedal pulses   Resp: no accessory muscle use, non-labored,  GI: no masses or tenderness, no hernia  Skin: no rashes, no areas of induration  Neuro: normal tone, normal sensation to touch Psych:  normal insight, alert and oriented MSK:  Right knee:  No obvious effusion  Normal flexion and extension  Normal strength to resistance  No pain with patellar grind No instability  Normal McMurray's test  Neurovascularly intact   Limited ultrasound: Right knee:  Mild effusion  Medial joint space with spurring   Summary: mild medial joint space  degenerative changes   Ultrasound and interpretation by Clearance Coots, MD       Aspiration/Injection Procedure Note Dale Bishop 10/25/1960  Procedure: Injection Indications: left knee pain   Procedure Details Consent: Risks of procedure as well as the alternatives and risks of each were explained to the (patient/caregiver).  Consent for procedure obtained. Time Out: Verified patient identification, verified procedure, site/side was marked, verified correct patient position, special equipment/implants available, medications/allergies/relevent history reviewed, required imaging and test results available.  Performed.  The area was cleaned with iodine and alcohol swabs.    The left knee superiorlateral SPP was injected using 1 cc's of 40 mg kenalog and 4 cc's of 0.5% bupivacaine with a 22 1 1/2" needle.  Ultrasound was used. Images were obtained in Long views showing the injection.    A sterile dressing was applied.  Patient did tolerate procedure well.           ASSESSMENT & PLAN:   Knee osteoarthritis Right knee is acute on chronic in nature. Left knee with injection on 7/25 - injection today  - pennsaid for the left knee  - counseled on supportive care - if no improvement consider PT

## 2018-03-04 MED FILL — ROSUVASTATIN CALCIUM 10 MG: 10 | 90 days supply | Qty: 90 | Fill #1

## 2018-03-05 MED FILL — LOSARTAN POTASSIUM 50 MG TA: 50 | 90 days supply | Qty: 90 | Fill #2

## 2018-03-05 MED FILL — AMLODIPINE BESYLATE 10 MG T: 10 | 90 days supply | Qty: 90 | Fill #1

## 2018-04-15 MED FILL — CLINDAMYCIN PH 1% SOLUTION: 1 | 10 days supply | Qty: 30 | Fill #1

## 2018-06-23 MED FILL — CARVEDILOL 12.5 MG TABLET: 12.5 | 90 days supply | Qty: 180 | Fill #1

## 2018-06-23 MED FILL — ROSUVASTATIN CALCIUM 10 MG: 10 | 90 days supply | Qty: 90 | Fill #2

## 2018-07-11 MED FILL — AMLODIPINE BESYLATE 10 MG T: 10 | 90 days supply | Qty: 90 | Fill #2

## 2018-07-13 MED FILL — LOSARTAN POTASSIUM 50 MG TA: 50 | 30 days supply | Qty: 30 | Fill #3

## 2018-07-24 MED FILL — AMLODIPINE BESYLATE 10 MG T: 10 | 90 days supply | Qty: 90 | Fill #2

## 2018-07-28 MED FILL — LOSARTAN POTASSIUM 50 MG TA: 50 | 30 days supply | Qty: 30 | Fill #3

## 2018-07-31 MED FILL — CLINDAMYCIN PH 1% SOLUTION: 1 | 30 days supply | Qty: 60 | Fill #2

## 2018-08-07 ENCOUNTER — Encounter: Payer: 59 | Admitting: Internal Medicine

## 2018-08-10 ENCOUNTER — Encounter: Payer: Self-pay | Admitting: Internal Medicine

## 2018-08-10 ENCOUNTER — Ambulatory Visit (INDEPENDENT_AMBULATORY_CARE_PROVIDER_SITE_OTHER): Payer: 59 | Admitting: Internal Medicine

## 2018-08-10 VITALS — BP 140/82 | HR 70 | Temp 97.6°F | Ht 71.0 in | Wt 259.0 lb

## 2018-08-10 DIAGNOSIS — M170 Bilateral primary osteoarthritis of knee: Secondary | ICD-10-CM

## 2018-08-10 DIAGNOSIS — R7309 Other abnormal glucose: Secondary | ICD-10-CM | POA: Diagnosis not present

## 2018-08-10 DIAGNOSIS — Z0000 Encounter for general adult medical examination without abnormal findings: Secondary | ICD-10-CM

## 2018-08-10 DIAGNOSIS — E785 Hyperlipidemia, unspecified: Secondary | ICD-10-CM | POA: Diagnosis not present

## 2018-08-10 DIAGNOSIS — M79605 Pain in left leg: Secondary | ICD-10-CM | POA: Diagnosis not present

## 2018-08-10 DIAGNOSIS — I10 Essential (primary) hypertension: Secondary | ICD-10-CM

## 2018-08-10 NOTE — Assessment & Plan Note (Signed)
Labs Loose wt

## 2018-08-10 NOTE — Assessment & Plan Note (Signed)
We discussed age appropriate health related issues, including available/recomended screening tests and vaccinations. We discussed a need for adhering to healthy diet and exercise. Labs/EKG were reviewed/ordered. All questions were answered. Colon 4/17 due in 2027 cardiac CT scan for calcium scoring offered 

## 2018-08-10 NOTE — Assessment & Plan Note (Signed)
Dr Raeford Razor

## 2018-08-10 NOTE — Assessment & Plan Note (Signed)
L sciatica NS ref

## 2018-08-10 NOTE — Assessment & Plan Note (Signed)
cardiac CT scan for calcium scoring offered  Crestor

## 2018-08-10 NOTE — Patient Instructions (Signed)

## 2018-08-10 NOTE — Progress Notes (Signed)
Subjective:  Patient ID: Dale Bishop, male    DOB: 03-17-1961  Age: 58 y.o. MRN: [id number]  CC: No chief complaint on file.   HPI Dale Bishop presents for a well exam C/o severe L leg pain returned  Outpatient Medications Prior to Visit  Medication Sig Dispense Refill  . amLODipine (NORVASC) 10 MG tablet Take 1 tablet (10 mg total) by mouth daily. 90 tablet 3  . carvedilol (COREG) 12.5 MG tablet TAKE 1 TABLET BY MOUTH TWICE A DAY WITH MEALS 180 tablet 2  . cetirizine (ZYRTEC) 10 MG tablet Take 10 mg by mouth daily.    . clindamycin (CLEOCIN T) 1 % external solution Apply topically 2 (two) times daily. Apply to rash 30 mL 3  . Diclofenac Sodium (PENNSAID) 2 % SOLN Place 1 application onto the skin 2 (two) times daily. 1 Bottle 3  . gabapentin (NEURONTIN) 100 MG capsule Take 1-3 capsules (100-300 mg total) by mouth at bedtime as needed. 90 capsule 3  . losartan (COZAAR) 50 MG tablet Take 1 tablet (50 mg total) by mouth daily. 90 tablet 3  . rosuvastatin (CRESTOR) 10 MG tablet TAKE 1 TABLET BY MOUTH ONCE DAILY 90 tablet 3  . traMADol (ULTRAM) 50 MG tablet Take 1 tablet (50 mg total) by mouth every 6 (six) hours as needed for severe pain. 60 tablet 0  . triamcinolone cream (KENALOG) 0.1 % APPLY TOPICALLY 2 TIMES DAILY. 30 g 0   No facility-administered medications prior to visit.     ROS: Review of Systems  Constitutional: Negative for appetite change, fatigue and unexpected weight change.  HENT: Negative for congestion, nosebleeds, sneezing, sore throat and trouble swallowing.   Eyes: Negative for itching and visual disturbance.  Respiratory: Negative for cough.   Cardiovascular: Negative for chest pain, palpitations and leg swelling.  Gastrointestinal: Negative for abdominal distention, blood in stool, diarrhea and nausea.  Genitourinary: Negative for frequency and hematuria.  Musculoskeletal: Positive for arthralgias, back pain and gait problem. Negative for joint swelling  and neck pain.  Skin: Negative for rash.  Neurological: Negative for dizziness, tremors, speech difficulty and weakness.  Psychiatric/Behavioral: Negative for agitation, dysphoric mood and sleep disturbance. The patient is not nervous/anxious.     Objective:  BP 140/82 (BP Location: Left Arm, Patient Position: Sitting, Cuff Size: Large)   Pulse 70   Temp 97.6 F (36.4 C) (Oral)   Ht 5\' 11"  (1.803 m)   Wt 259 lb (117.5 kg)   SpO2 97%   BMI 36.12 kg/m   BP Readings from Last 3 Encounters:  08/10/18 140/82  02/20/18 136/74  02/12/18 (!) 148/86    Wt Readings from Last 3 Encounters:  08/10/18 259 lb (117.5 kg)  02/20/18 253 lb (114.8 kg)  02/12/18 254 lb (115.2 kg)    Physical Exam Constitutional:      General: He is not in acute distress.    Appearance: He is well-developed.     Comments: NAD  Eyes:     Conjunctiva/sclera: Conjunctivae normal.     Pupils: Pupils are equal, round, and reactive to light.  Neck:     Musculoskeletal: Normal range of motion.     Thyroid: No thyromegaly.     Vascular: No JVD.  Cardiovascular:     Rate and Rhythm: Normal rate and regular rhythm.     Heart sounds: Normal heart sounds. No murmur. No friction rub. No gallop.   Pulmonary:     Effort: Pulmonary effort is normal. No respiratory  distress.     Breath sounds: Normal breath sounds. No wheezing or rales.  Chest:     Chest wall: No tenderness.  Abdominal:     General: Bowel sounds are normal. There is no distension.     Palpations: Abdomen is soft. There is no mass.     Tenderness: There is no abdominal tenderness. There is no guarding or rebound.  Musculoskeletal: Normal range of motion.        General: Tenderness present.  Lymphadenopathy:     Cervical: No cervical adenopathy.  Skin:    General: Skin is warm and dry.     Findings: No rash.  Neurological:     Mental Status: He is alert and oriented to person, place, and time.     Cranial Nerves: No cranial nerve deficit.      Motor: No abnormal muscle tone.     Coordination: Coordination normal.     Gait: Gait normal.     Deep Tendon Reflexes: Reflexes are normal and symmetric.  Psychiatric:        Behavior: Behavior normal.        Thought Content: Thought content normal.        Judgment: Judgment normal.    LS tender w/ROM Obese Pt declined prostate exam  Lab Results  Component Value Date   WBC 5.7 08/06/2017   HGB 14.9 08/06/2017   HCT 45.2 08/06/2017   PLT 315.0 08/06/2017   GLUCOSE 96 01/14/2018   CHOL 222 (H) 08/06/2017   TRIG 154.0 (H) 08/06/2017   HDL 46.90 08/06/2017   LDLCALC 145 (H) 08/06/2017   ALT 15 08/06/2017   AST 13 08/06/2017   NA 140 01/14/2018   K 4.4 01/14/2018   CL 105 01/14/2018   CREATININE 1.24 01/14/2018   BUN 18 01/14/2018   CO2 30 01/14/2018   TSH 1.36 08/06/2017   PSA 1.24 08/06/2017   HGBA1C 6.5 01/14/2018    Dg Knee Complete 4 Views Left  Result Date: 01/08/2018 CLINICAL DATA:  Ten days of medial knee pain without known injury. EXAM: LEFT KNEE - COMPLETE 4+ VIEW COMPARISON:  None in PACs FINDINGS: The bones are subjectively adequately mineralized. There is no acute or healing fracture. The joint spaces are reasonably well-maintained. There is beaking of the tibial spines. Small spurs arise from the articular margins of the medial femoral condyle and medial tibial plateau. There spurs arising from the articular margins of the patella. There is no joint effusion. IMPRESSION: There is no acute bony abnormality of the left knee. 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 Electronically Signed   By: David  Martinique M.D.   On: 01/08/2018 11:07    Assessment & Plan:   There are no diagnoses linked to this encounter.   No orders of the defined types were placed in this encounter.    Follow-up: No follow-ups on file.  Walker Kehr, MD

## 2018-08-19 MED FILL — CLINDAMYCIN HCL 300 MG CAPS: 300 | 7 days supply | Qty: 21 | Fill #0

## 2018-09-03 ENCOUNTER — Other Ambulatory Visit: Payer: Self-pay | Admitting: Internal Medicine

## 2018-09-03 MED FILL — LOSARTAN POTASSIUM 50 MG TA: 50 | 90 days supply | Qty: 90 | Fill #0

## 2018-09-09 ENCOUNTER — Encounter: Payer: Self-pay | Admitting: Internal Medicine

## 2018-10-23 MED FILL — ROSUVASTATIN CALCIUM 10 MG: 10 | 90 days supply | Qty: 90 | Fill #3

## 2018-10-30 DIAGNOSIS — M4316 Spondylolisthesis, lumbar region: Secondary | ICD-10-CM | POA: Diagnosis not present

## 2018-11-25 ENCOUNTER — Other Ambulatory Visit: Payer: Self-pay | Admitting: Internal Medicine

## 2018-11-25 IMAGING — DX DG KNEE COMPLETE 4+V*L*
4 series · 4 of 4 positions shown · non-contrast
Comparison: None in PACs

CLINICAL DATA: Ten days of medial knee pain without known injury.

EXAM:
LEFT KNEE - COMPLETE 4+ VIEW

[knee ap]
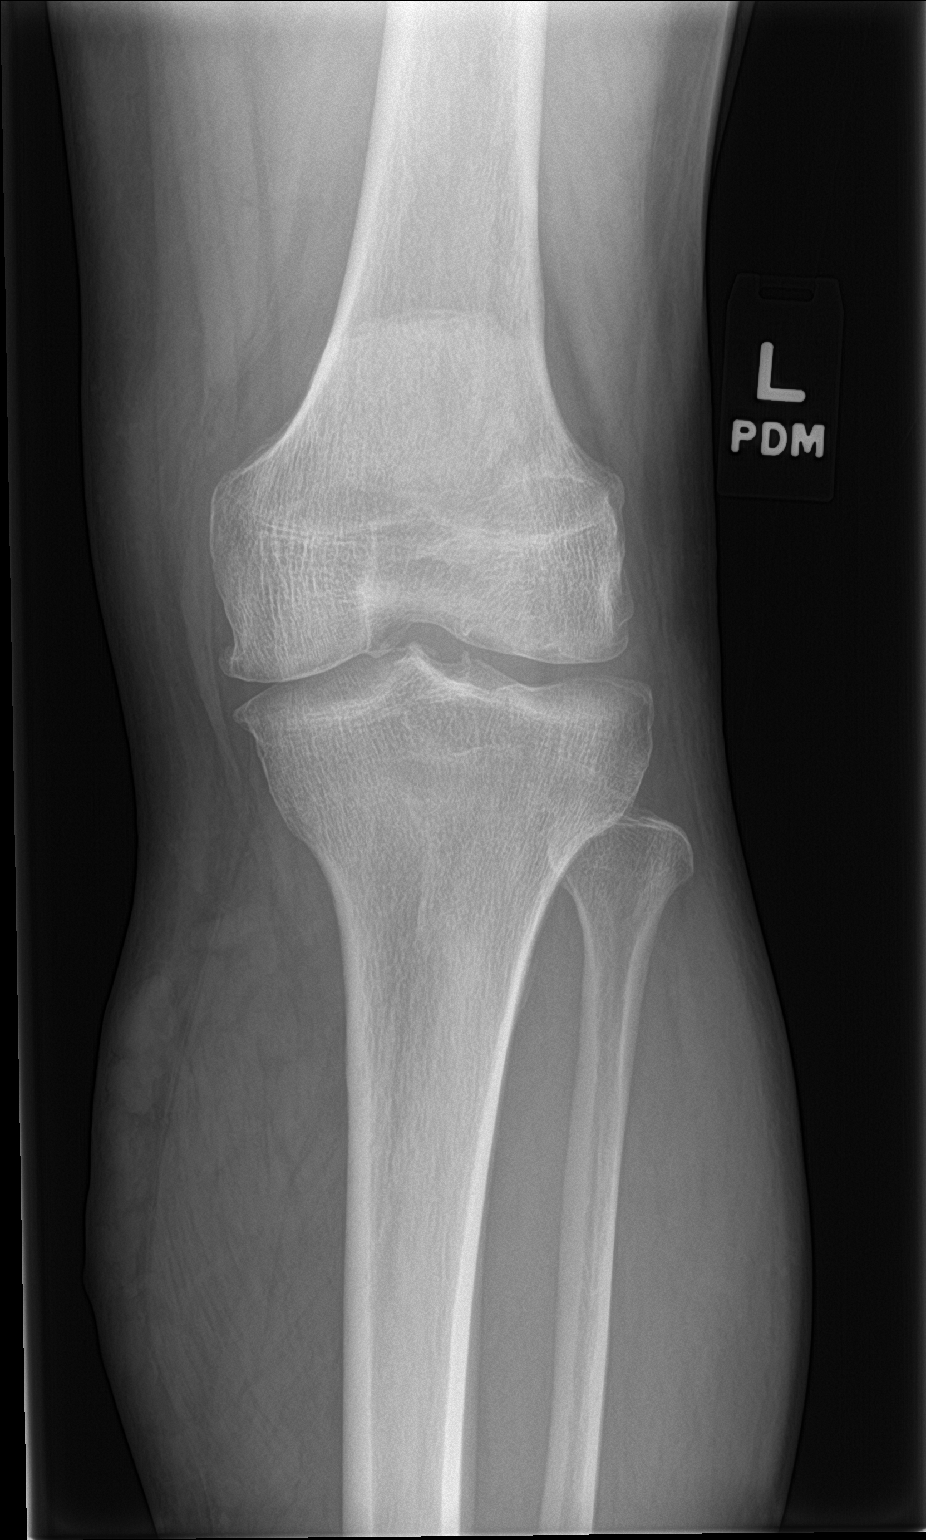

[knee lat]
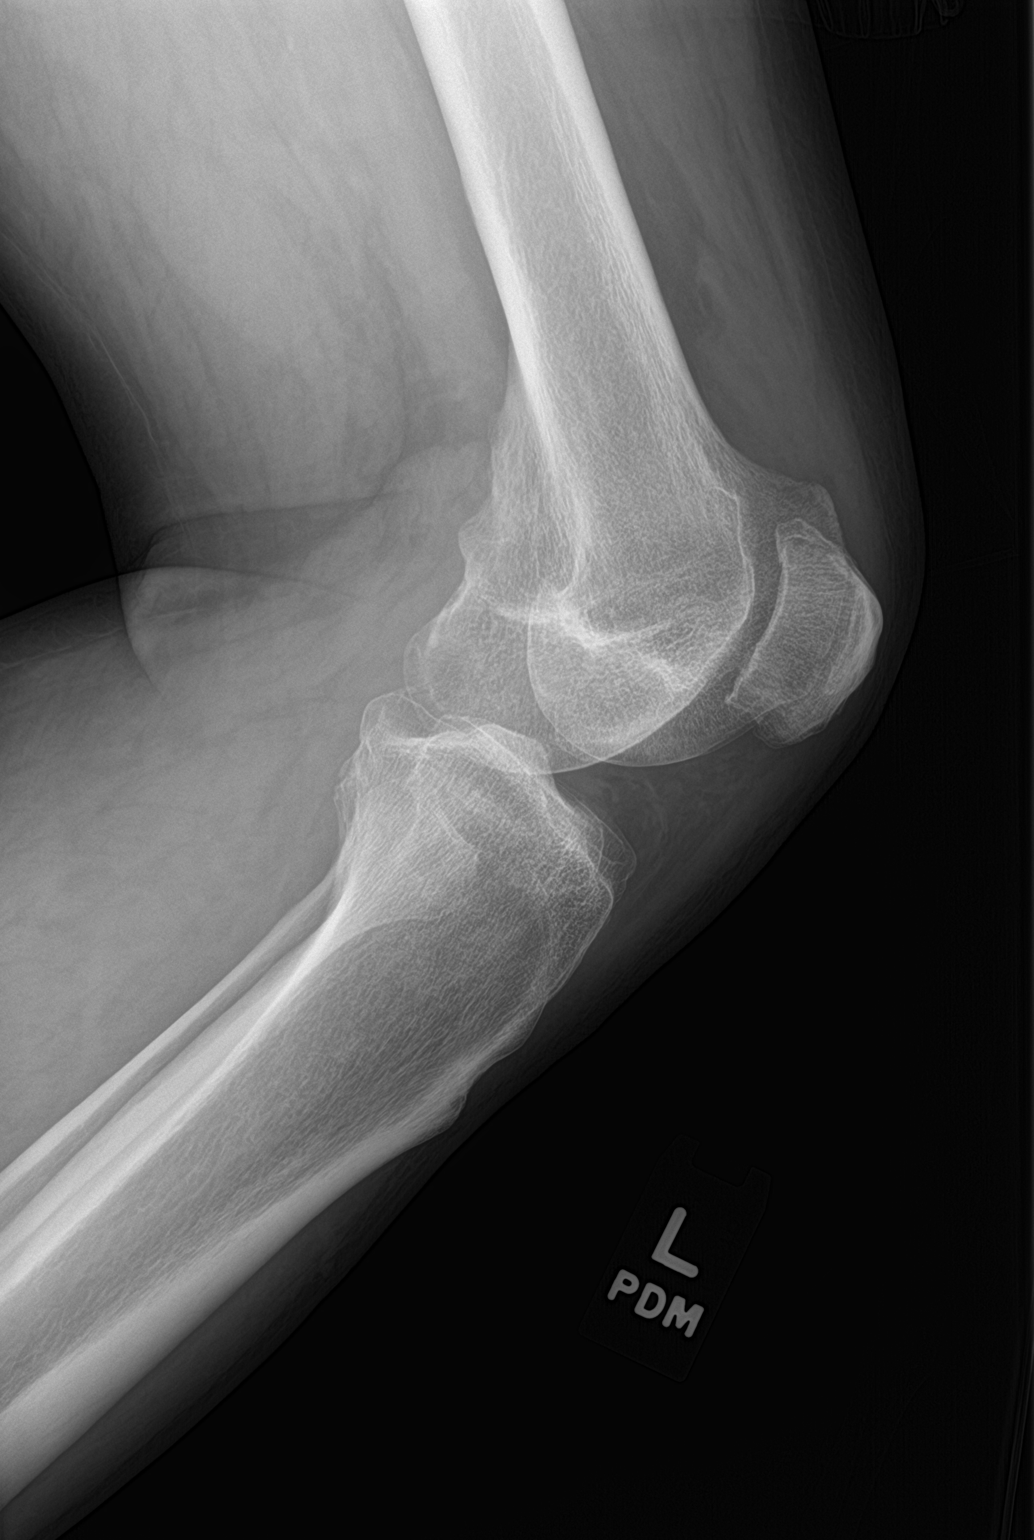

[knee obl (1 of 2)]
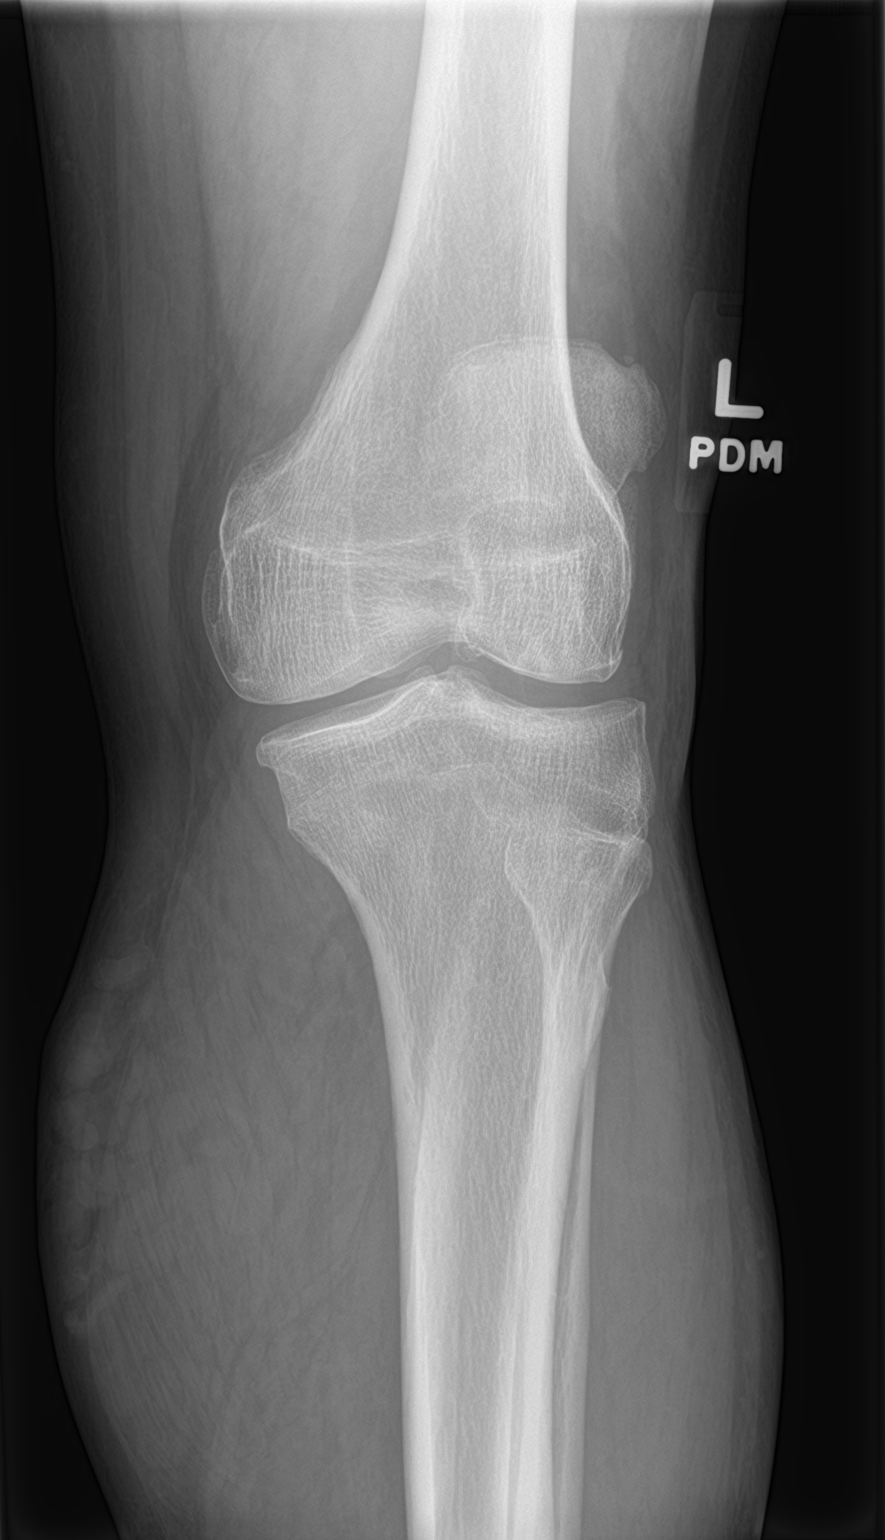

[knee obl (2 of 2)]
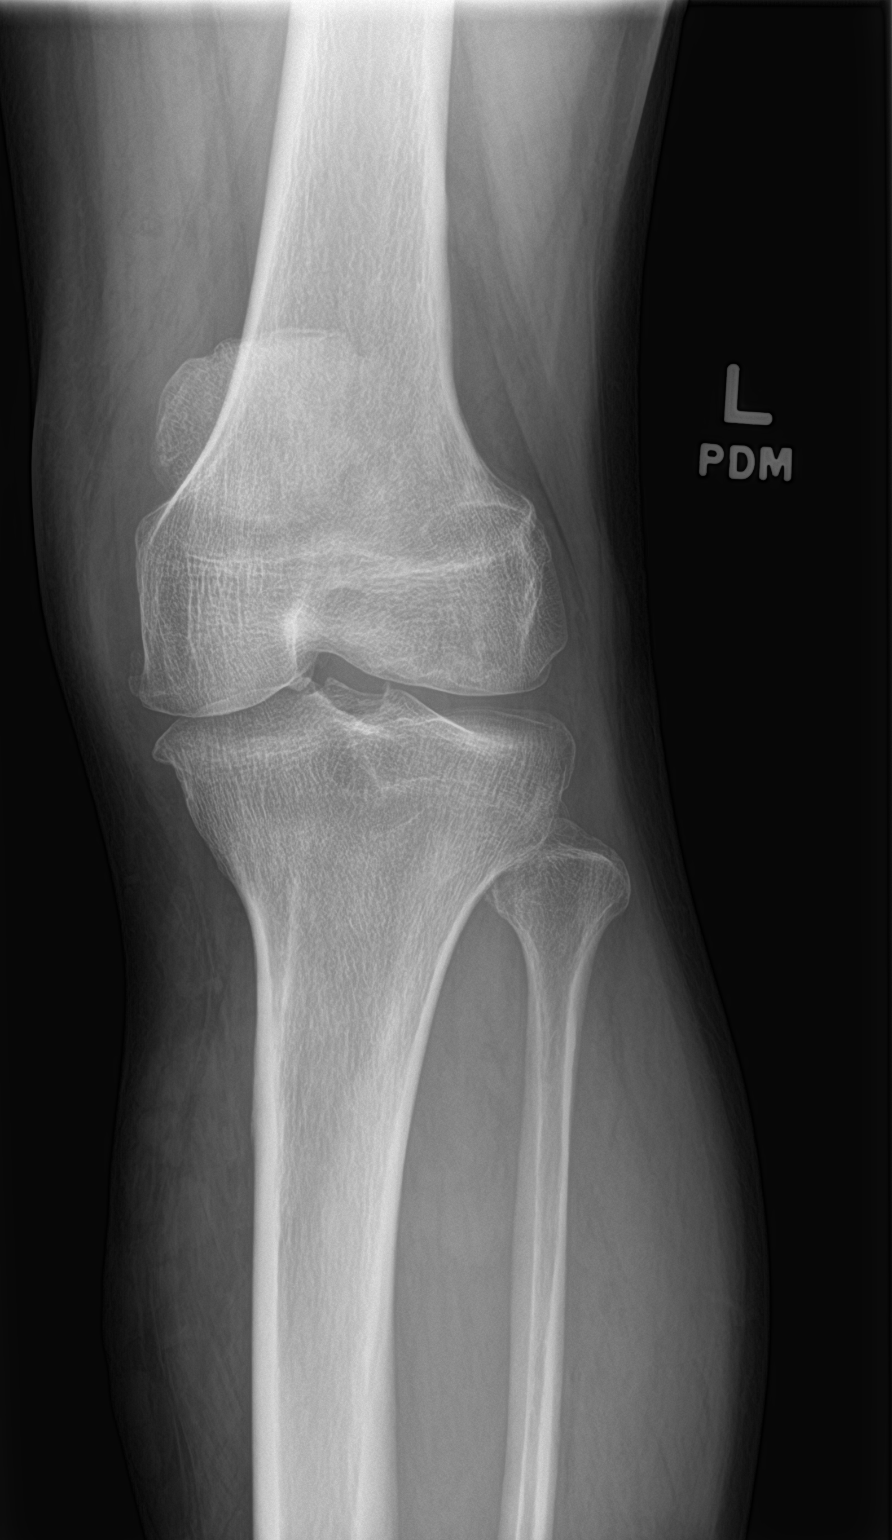

[4 of 4 positions shown; findings below may reference images not displayed]

FINDINGS: The bones are subjectively adequately mineralized. There is no acute
or healing fracture. The joint spaces are reasonably
well-maintained. There is beaking of the tibial spines. Small spurs
arise from the articular margins of the medial femoral condyle and
medial tibial plateau. There spurs arising from the articular
margins of the patella. There is no joint effusion.
IMPRESSION: There is no acute bony abnormality of the left knee. There are mild
osteoarthritic changes centered on the medial and patellofemoral
compartments.

## 2018-11-25 MED FILL — AMLODIPINE BESYLATE 10 MG T: 10 | 90 days supply | Qty: 90 | Fill #0

## 2019-01-07 MED FILL — LOSARTAN POTASSIUM 50 MG TA: 50 | 30 days supply | Qty: 30 | Fill #1

## 2019-02-10 ENCOUNTER — Other Ambulatory Visit: Payer: Self-pay | Admitting: Internal Medicine

## 2019-02-10 MED FILL — ROSUVASTATIN CALCIUM 10 MG: 10 | 90 days supply | Qty: 90 | Fill #0

## 2019-02-10 MED FILL — CARVEDILOL 12.5 MG TABLET: 12.5 | 90 days supply | Qty: 180 | Fill #0

## 2019-02-10 MED FILL — LOSARTAN POTASSIUM 50 MG TA: 50 | 30 days supply | Qty: 30 | Fill #2

## 2019-03-17 MED FILL — AMLODIPINE BESYLATE 10 MG T: 10 | 90 days supply | Qty: 90 | Fill #1

## 2019-03-18 MED FILL — LOSARTAN POTASSIUM 50 MG TA: 50 | 30 days supply | Qty: 30 | Fill #3

## 2019-04-26 MED FILL — LOSARTAN POTASSIUM 50 MG TA: 50 | 30 days supply | Qty: 30 | Fill #4

## 2019-06-06 MED FILL — ROSUVASTATIN CALCIUM 10 MG: 10 | 90 days supply | Qty: 90 | Fill #1

## 2019-06-06 MED FILL — LOSARTAN POTASSIUM 50 MG TA: 50 | 30 days supply | Qty: 30 | Fill #5

## 2019-06-08 ENCOUNTER — Ambulatory Visit (INDEPENDENT_AMBULATORY_CARE_PROVIDER_SITE_OTHER): Payer: 59 | Admitting: Internal Medicine

## 2019-06-08 ENCOUNTER — Encounter: Payer: Self-pay | Admitting: Internal Medicine

## 2019-06-08 ENCOUNTER — Ambulatory Visit: Payer: 59 | Admitting: Internal Medicine

## 2019-06-08 ENCOUNTER — Other Ambulatory Visit: Payer: Self-pay

## 2019-06-08 VITALS — BP 138/78 | HR 70 | Temp 97.8°F | Ht 71.0 in | Wt 269.0 lb

## 2019-06-08 DIAGNOSIS — Z0000 Encounter for general adult medical examination without abnormal findings: Secondary | ICD-10-CM | POA: Diagnosis not present

## 2019-06-08 DIAGNOSIS — E118 Type 2 diabetes mellitus with unspecified complications: Secondary | ICD-10-CM

## 2019-06-08 DIAGNOSIS — N410 Acute prostatitis: Secondary | ICD-10-CM

## 2019-06-08 DIAGNOSIS — E559 Vitamin D deficiency, unspecified: Secondary | ICD-10-CM

## 2019-06-08 DIAGNOSIS — E785 Hyperlipidemia, unspecified: Secondary | ICD-10-CM

## 2019-06-08 DIAGNOSIS — I10 Essential (primary) hypertension: Secondary | ICD-10-CM

## 2019-06-08 DIAGNOSIS — R310 Gross hematuria: Secondary | ICD-10-CM

## 2019-06-08 DIAGNOSIS — Z23 Encounter for immunization: Secondary | ICD-10-CM

## 2019-06-08 LAB — HEMOGLOBIN A1C: Hgb A1c MFr Bld: 6.4 % (ref 4.6–6.5)

## 2019-06-08 LAB — URINALYSIS, ROUTINE W REFLEX MICROSCOPIC
Ketones, ur: NEGATIVE
Nitrite: NEGATIVE
RBC / HPF: NONE SEEN (ref 0–?)
Specific Gravity, Urine: >=1.03 — AB (ref 1.000–1.030)
Total Protein, Urine: NEGATIVE
Urine Glucose: NEGATIVE
Urobilinogen, UA: 0.2 (ref 0.0–1.0)
pH: 5.5 (ref 5.0–8.0)

## 2019-06-08 LAB — CBC WITH DIFFERENTIAL/PLATELET
Basophils Absolute: 0.1 10*3/uL (ref 0.0–0.1)
Basophils Relative: 0.5 % (ref 0.0–3.0)
Eosinophils Absolute: 0.3 10*3/uL (ref 0.0–0.7)
Eosinophils Relative: 2.2 % (ref 0.0–5.0)
HCT: 44.3 % (ref 39.0–52.0)
Hemoglobin: 14.4 g/dL (ref 13.0–17.0)
Lymphocytes Relative: 16.5 % (ref 12.0–46.0)
Lymphs Abs: 1.9 10*3/uL (ref 0.7–4.0)
MCHC: 32.5 g/dL (ref 30.0–36.0)
MCV: 82.8 fl (ref 78.0–100.0)
Monocytes Absolute: 1.2 10*3/uL — ABNORMAL HIGH (ref 0.1–1.0)
Monocytes Relative: 10.1 % (ref 3.0–12.0)
Neutro Abs: 8.1 10*3/uL — ABNORMAL HIGH (ref 1.4–7.7)
Neutrophils Relative %: 70.7 % (ref 43.0–77.0)
Platelets: 344 10*3/uL (ref 150.0–400.0)
RBC: 5.35 Mil/uL (ref 4.22–5.81)
RDW: 13.9 % (ref 11.5–15.5)
WBC: 11.5 10*3/uL — ABNORMAL HIGH (ref 4.0–10.5)

## 2019-06-08 LAB — BASIC METABOLIC PANEL
BUN: 13 mg/dL (ref 6–23)
CO2: 25 mEq/L (ref 19–32)
Calcium: 10.2 mg/dL (ref 8.4–10.5)
Chloride: 103 mEq/L (ref 96–112)
Creatinine, Ser: 1.19 mg/dL (ref 0.40–1.50)
GFR: 76.18 mL/min (ref >60.00)
Glucose, Bld: 84 mg/dL (ref 70–99)
Potassium: 4 mEq/L (ref 3.5–5.1)
Sodium: 138 mEq/L (ref 135–145)

## 2019-06-08 LAB — LIPID PANEL
Cholesterol: 186 mg/dL (ref 0–200)
HDL: 44.1 mg/dL (ref >39.00)
LDL Cholesterol: 115 mg/dL — ABNORMAL HIGH (ref 0–99)
NonHDL: 141.65
Total CHOL/HDL Ratio: 4
Triglycerides: 131 mg/dL (ref 0.0–149.0)
VLDL: 26.2 mg/dL (ref 0.0–40.0)

## 2019-06-08 LAB — PSA: PSA: 15.8 ng/mL — ABNORMAL HIGH (ref 0.10–4.00)

## 2019-06-08 LAB — MICROALBUMIN / CREATININE URINE RATIO
Creatinine,U: 251.5 mg/dL
Microalb Creat Ratio: 1.4 mg/g (ref 0.0–30.0)
Microalb, Ur: 3.6 mg/dL — ABNORMAL HIGH (ref 0.0–1.9)

## 2019-06-08 LAB — VITAMIN D 25 HYDROXY (VIT D DEFICIENCY, FRACTURES): VITD: 14.44 ng/mL — ABNORMAL LOW (ref 30.00–100.00)

## 2019-06-08 LAB — TSH: TSH: 2.34 u[IU]/mL (ref 0.35–4.50)

## 2019-06-08 MED ORDER — LEVOFLOXACIN 500 MG PO TABS: 500.0000 mg | ORAL_TABLET | Freq: Every day | ORAL | 0 refills | Status: AC

## 2019-06-08 MED ORDER — CHOLECALCIFEROL 1.25 MG (50000 UT) PO CAPS: 50000.0000 [IU] | ORAL_CAPSULE | ORAL | 1 refills | Status: DC

## 2019-06-08 MED FILL — VITAMIN D3 50,000 UNITS CAP: 1.25 MG | 84 days supply | Qty: 12 | Fill #0

## 2019-06-08 MED FILL — levoFLOXacin 500 MG TABS: 500 | 30 days supply | Qty: 30 | Fill #0

## 2019-06-08 NOTE — Progress Notes (Signed)
Subjective:  Patient ID: Dale Bishop, male    DOB: Jun 08, 1961  Age: 58 y.o. MRN: [id number]  CC: Annual Exam, Diabetes, and Hyperlipidemia  This visit occurred during the SARS-CoV-2 public health emergency.  Safety protocols were in place, including screening questions prior to the visit, additional usage of staff PPE, and extensive cleaning of exam room while observing appropriate contact time as indicated for disinfecting solutions.   New to me  HPI Dale Bishop presents for a CPX.  He complains of a 1 week history of dysuria and hematuria.  Outpatient Medications Prior to Visit  Medication Sig Dispense Refill  . amLODipine (NORVASC) 10 MG tablet TAKE 1 TABLET (10 MG TOTAL) BY MOUTH DAILY. 90 tablet 2  . carvedilol (COREG) 12.5 MG tablet TAKE 1 TABLET BY MOUTH TWICE DAILY WITH MEALS 180 tablet 2  . cetirizine (ZYRTEC) 10 MG tablet Take 10 mg by mouth daily.    . clindamycin (CLEOCIN T) 1 % external solution Apply topically 2 (two) times daily. Apply to rash 30 mL 3  . losartan (COZAAR) 50 MG tablet TAKE 1 TABLET BY MOUTH ONCE DAILY 90 tablet 3  . rosuvastatin (CRESTOR) 10 MG tablet TAKE 1 TABLET BY MOUTH ONCE DAILY 90 tablet 3  . triamcinolone cream (KENALOG) 0.1 % APPLY TOPICALLY 2 TIMES DAILY. 30 g 0  . Diclofenac Sodium (PENNSAID) 2 % SOLN Place 1 application onto the skin 2 (two) times daily. 1 Bottle 3  . gabapentin (NEURONTIN) 100 MG capsule Take 1-3 capsules (100-300 mg total) by mouth at bedtime as needed. 90 capsule 3  . traMADol (ULTRAM) 50 MG tablet Take 1 tablet (50 mg total) by mouth every 6 (six) hours as needed for severe pain. 60 tablet 0   No facility-administered medications prior to visit.    ROS Review of Systems  Constitutional: Positive for chills. Negative for diaphoresis, fatigue and fever.  HENT: Negative.   Eyes: Negative for visual disturbance.  Respiratory: Negative.  Negative for cough, chest tightness, shortness of breath and wheezing.    Cardiovascular: Negative for chest pain, palpitations and leg swelling.  Gastrointestinal: Negative for abdominal pain, constipation, diarrhea and nausea.  Endocrine: Negative.   Genitourinary: Positive for dysuria and hematuria. Negative for decreased urine volume, difficulty urinating, discharge, flank pain, frequency, genital sores, penile pain, penile swelling, scrotal swelling, testicular pain and urgency.  Musculoskeletal: Negative.  Negative for arthralgias and myalgias.  Skin: Negative.  Negative for color change and rash.  Neurological: Negative for dizziness, weakness, light-headedness and headaches.  Hematological: Negative for adenopathy. Does not bruise/bleed easily.  Psychiatric/Behavioral: Negative.     Objective:  BP 138/78 (BP Location: Left Arm, Patient Position: Sitting, Cuff Size: Large)   Pulse 70   Temp 97.8 F (36.6 C) (Oral)   Ht 5\' 11"  (1.803 m)   Wt 269 lb (122 kg)   SpO2 97%   BMI 37.52 kg/m   BP Readings from Last 3 Encounters:  06/08/19 138/78  08/10/18 140/82  02/20/18 136/74    Wt Readings from Last 3 Encounters:  06/08/19 269 lb (122 kg)  08/10/18 259 lb (117.5 kg)  02/20/18 253 lb (114.8 kg)    Physical Exam Vitals reviewed.  Constitutional:      Appearance: Normal appearance.  HENT:     Nose: Nose normal.     Mouth/Throat:     Mouth: Mucous membranes are moist.  Eyes:     General: No scleral icterus.    Conjunctiva/sclera: Conjunctivae normal.  Cardiovascular:  Rate and Rhythm: Normal rate and regular rhythm.     Heart sounds: No murmur.  Pulmonary:     Effort: Pulmonary effort is normal.     Breath sounds: No stridor. No wheezing, rhonchi or rales.  Abdominal:     General: Abdomen is flat. Bowel sounds are normal. There is no distension.     Palpations: Abdomen is soft. There is no hepatomegaly or splenomegaly.     Tenderness: There is no abdominal tenderness.     Hernia: No hernia is present. There is no hernia in the  left inguinal area or right inguinal area.  Genitourinary:    Pubic Area: No rash.      Penis: Normal and circumcised. No discharge, swelling or lesions.      Testes: Normal.        Right: Mass, tenderness or swelling not present.        Left: Mass, tenderness or swelling not present.     Epididymis:     Right: Normal. Not inflamed or enlarged. No mass.     Left: Normal. Not inflamed or enlarged. No mass.     Prostate: Enlarged and tender. No nodules present.     Rectum: Normal. Guaiac result negative. No mass, tenderness, anal fissure, external hemorrhoid or internal hemorrhoid. Normal anal tone.  Musculoskeletal:        General: Normal range of motion.     Cervical back: Neck supple.     Right lower leg: No edema.     Left lower leg: No edema.  Lymphadenopathy:     Cervical: No cervical adenopathy.     Lower Body: No right inguinal adenopathy. No left inguinal adenopathy.  Skin:    General: Skin is warm and dry.     Coloration: Skin is not pale.     Findings: No rash.  Neurological:     General: No focal deficit present.     Mental Status: He is alert.  Psychiatric:        Mood and Affect: Mood normal.        Behavior: Behavior normal.     Lab Results  Component Value Date   WBC 11.5 (H) 06/08/2019   HGB 14.4 06/08/2019   HCT 44.3 06/08/2019   PLT 344.0 06/08/2019   GLUCOSE 84 06/08/2019   CHOL 186 06/08/2019   TRIG 131.0 06/08/2019   HDL 44.10 06/08/2019   LDLCALC 115 (H) 06/08/2019   ALT 15 08/06/2017   AST 13 08/06/2017   NA 138 06/08/2019   K 4.0 06/08/2019   CL 103 06/08/2019   CREATININE 1.19 06/08/2019   BUN 13 06/08/2019   CO2 25 06/08/2019   TSH 2.34 06/08/2019   PSA 15.80 (H) 06/08/2019   HGBA1C 6.4 06/08/2019   MICROALBUR 3.6 (H) 06/08/2019    DG Knee Complete 4 Views Left  Result Date: 01/08/2018 CLINICAL DATA:  Ten days of medial knee pain without known injury. EXAM: LEFT KNEE - COMPLETE 4+ VIEW COMPARISON:  None in PACs FINDINGS: The bones  are subjectively adequately mineralized. There is no acute or healing fracture. The joint spaces are reasonably well-maintained. There is beaking of the tibial spines. Small spurs arise from the articular margins of the medial femoral condyle and medial tibial plateau. There spurs arising from the articular margins of the patella. There is no joint effusion. IMPRESSION: There is no acute bony abnormality of the left knee. 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 Electronically Signed  By: David  Martinique M.D.   On: 01/08/2018 11:07    Assessment & Plan:   Dale Bishop was seen today for annual exam, diabetes and hyperlipidemia.  Diagnoses and all orders for this visit:  Essential hypertension- His blood pressure is adequately well controlled. -     CBC with Differential -     Basic metabolic panel -     TSH -     Urinalysis, Routine w reflex microscopic  Vitamin D deficiency -     Vitamin D 25 hydroxy -     Cholecalciferol 1.25 MG (50000 UT) capsule; Take 1 capsule (50,000 Units total) by mouth once a week.  Type II diabetes mellitus with manifestations ([place])- His blood sugars are adequately well controlled.  Medical therapy is not indicated. -     Basic metabolic panel -     Hemoglobin A1c -     Urinalysis, Routine w reflex microscopic -     Microalbumin / creatinine urine ratio -     Ambulatory referral to Ophthalmology -     HM Diabetes Foot Exam  Hyperlipidemia with target LDL less than 100- He has achieved his LDL goal and is doing well on the statin -     TSH  Routine general medical examination at a health care facility- Exam completed, labs reviewed, vaccines reviewed and updated, colon cancer screening is up-to-date, patient education was given. -     Lipid panel -     PSA -     HIV antibody ([place])  Gross hematuria- Based on his symptoms, exam, and abnormal urinalysis this is consistent with acute bacterial prostatitis.  I  recommended that he treat this with a 30-day course of levofloxacin.  I will also screen him for gonorrhea, chlamydia, and trichomonas. -     Chlamydia/Gonococcus/Trichomonas, NAA -     Urine Culture Comprehensive; Future -     Urine Culture Comprehensive  Need for pneumococcal vaccination -     Pneumococcal polysaccharide vaccine 23-valent greater than or equal to 2yo subcutaneous/IM  Need for Tdap vaccination -     Tdap vaccine greater than or equal to 7yo IM  Acute prostatitis -     levofloxacin (LEVAQUIN) 500 MG tablet; Take 1 tablet (500 mg total) by mouth daily.   I have discontinued Mica Hosie's gabapentin, traMADol, and Diclofenac Sodium. I am also having him start on Cholecalciferol and levofloxacin. Additionally, I am having him maintain his cetirizine, triamcinolone cream, clindamycin, losartan, amLODipine, rosuvastatin, and carvedilol.  Meds ordered this encounter  Medications  . Cholecalciferol 1.25 MG (50000 UT) capsule    Sig: Take 1 capsule (50,000 Units total) by mouth once a week.    Dispense:  12 capsule    Refill:  1  . levofloxacin (LEVAQUIN) 500 MG tablet    Sig: Take 1 tablet (500 mg total) by mouth daily.    Dispense:  30 tablet    Refill:  0     Follow-up: Return in about 6 weeks (around 07/20/2019).  Scarlette Calico, MD

## 2019-06-08 NOTE — Patient Instructions (Signed)
Hematuria, Adult Hematuria is blood in the urine. Blood may be visible in the urine, or it may be identified with a test. This condition can be caused by infections of the bladder, urethra, kidney, or prostate. Other possible causes include:  Kidney stones.  Cancer of the urinary tract.  Too much calcium in the urine.  Conditions that are passed from parent to child (inherited conditions).  Exercise that requires a lot of energy. Infections can usually be treated with medicine, and a kidney stone usually will pass through your urine. If neither of these is the cause of your hematuria, more tests may be needed to identify the cause of your symptoms. It is very important to tell your health care provider about any blood in your urine, even if it is painless or the blood stops without treatment. Blood in the urine, when it happens and then stops and then happens again, can be a symptom of a very serious condition, including cancer. There is no pain in the initial stages of many urinary cancers. Follow these instructions at home: Medicines  Take over-the-counter and prescription medicines only as told by your health care provider.  If you were prescribed an antibiotic medicine, take it as told by your health care provider. Do not stop taking the antibiotic even if you start to feel better. Eating and drinking  Drink enough fluid to keep your urine clear or pale yellow. It is recommended that you drink 3-4 quarts (2.8-3.8 L) a day. If you have been diagnosed with an infection, it is recommended that you drink cranberry juice in addition to large amounts of water.  Avoid caffeine, tea, and carbonated beverages. These tend to irritate the bladder.  Avoid alcohol because it may irritate the prostate (men). General instructions  If you have been diagnosed with a kidney stone, follow your health care provider's instructions about straining your urine to catch the stone.  Empty your bladder  often. Avoid holding urine for long periods of time.  If you are male: ? After a bowel movement, wipe from front to back and use each piece of toilet paper only once. ? Empty your bladder before and after sex.  Pay attention to any changes in your symptoms. Tell your health care provider about any changes or any new symptoms.  It is your responsibility to get your test results. Ask your health care provider, or the department performing the test, when your results will be ready.  Keep all follow-up visits as told by your health care provider. This is important. Contact a health care provider if:  You develop back pain.  You have a fever.  You have nausea or vomiting.  Your symptoms do not improve after 3 days.  Your symptoms get worse. Get help right away if:  You develop severe vomiting and are unable take medicine without vomiting.  You develop severe pain in your back or abdomen even though you are taking medicine.  You pass a large amount of blood in your urine.  You pass blood clots in your urine.  You feel very weak or like you might faint.  You faint. Summary  Hematuria is blood in the urine. It has many possible causes.  It is very important that you tell your health care provider about any blood in your urine, even if it is painless or the blood stops without treatment.  Take over-the-counter and prescription medicines only as told by your health care provider.  Drink enough fluid to keep   your urine clear or pale yellow. This information is not intended to replace advice given to you by your health care provider. Make sure you discuss any questions you have with your health care provider. Document Released: 06/03/2005 Document Revised: 05/16/2017 Document Reviewed: 07/06/2016 Elsevier Patient Education  2020 [employer].

## 2019-06-09 LAB — HIV ANTIBODY (ROUTINE TESTING W REFLEX): HIV 1&2 Ab, 4th Generation: NONREACTIVE

## 2019-06-10 ENCOUNTER — Encounter: Payer: Self-pay | Admitting: Internal Medicine

## 2019-06-10 LAB — CHLAMYDIA/GONOCOCCUS/TRICHOMONAS, NAA
Chlamydia by NAA: NEGATIVE
Gonococcus by NAA: NEGATIVE
Trich vag by NAA: NEGATIVE

## 2019-06-10 LAB — CULTURE, URINE COMPREHENSIVE

## 2019-06-12 ENCOUNTER — Encounter: Payer: Self-pay | Admitting: Internal Medicine

## 2019-07-05 MED FILL — LOSARTAN POTASSIUM 50 MG TA: 50 | 30 days supply | Qty: 30 | Fill #6

## 2019-07-05 MED FILL — AMLODIPINE BESYLATE 10 MG T: 10 | 90 days supply | Qty: 90 | Fill #2

## 2019-07-12 ENCOUNTER — Encounter: Payer: Self-pay | Admitting: Internal Medicine

## 2019-07-13 ENCOUNTER — Encounter: Payer: Self-pay | Admitting: Internal Medicine

## 2019-08-12 ENCOUNTER — Encounter: Payer: 59 | Admitting: Internal Medicine

## 2019-08-12 MED FILL — LOSARTAN POTASSIUM 50 MG TA: 50 | 30 days supply | Qty: 30 | Fill #7

## 2019-08-18 ENCOUNTER — Encounter: Payer: Self-pay | Admitting: Internal Medicine

## 2019-08-18 ENCOUNTER — Ambulatory Visit (INDEPENDENT_AMBULATORY_CARE_PROVIDER_SITE_OTHER): Payer: 59 | Admitting: Internal Medicine

## 2019-08-18 ENCOUNTER — Other Ambulatory Visit: Payer: Self-pay

## 2019-08-18 ENCOUNTER — Other Ambulatory Visit: Payer: Self-pay | Admitting: Internal Medicine

## 2019-08-18 DIAGNOSIS — N529 Male erectile dysfunction, unspecified: Secondary | ICD-10-CM | POA: Diagnosis not present

## 2019-08-18 DIAGNOSIS — R310 Gross hematuria: Secondary | ICD-10-CM

## 2019-08-18 DIAGNOSIS — E118 Type 2 diabetes mellitus with unspecified complications: Secondary | ICD-10-CM | POA: Diagnosis not present

## 2019-08-18 DIAGNOSIS — I10 Essential (primary) hypertension: Secondary | ICD-10-CM | POA: Diagnosis not present

## 2019-08-18 LAB — BASIC METABOLIC PANEL
BUN: 12 mg/dL (ref 6–23)
CO2: 24 mEq/L (ref 19–32)
Calcium: 9.5 mg/dL (ref 8.4–10.5)
Chloride: 105 mEq/L (ref 96–112)
Creatinine, Ser: 1.23 mg/dL (ref 0.40–1.50)
GFR: 73.28 mL/min (ref >60.00)
Glucose, Bld: 108 mg/dL — ABNORMAL HIGH (ref 70–99)
Potassium: 3.9 mEq/L (ref 3.5–5.1)
Sodium: 139 mEq/L (ref 135–145)

## 2019-08-18 LAB — TESTOSTERONE: Testosterone: 236.28 ng/dL — ABNORMAL LOW (ref 300.00–890.00)

## 2019-08-18 MED ORDER — TADALAFIL 20 MG PO TABS: 10.0000 mg | ORAL_TABLET | ORAL | 5 refills | Status: DC | PRN

## 2019-08-18 MED ORDER — TRAMADOL HCL 50 MG PO TABS: 50.0000 mg | ORAL_TABLET | Freq: Four times a day (QID) | ORAL | 0 refills | Status: DC | PRN

## 2019-08-18 MED FILL — TADALAFIL 20 MG TABS: 20 | 30 days supply | Qty: 6 | Fill #0

## 2019-08-18 NOTE — Assessment & Plan Note (Signed)
BP Readings from Last 3 Encounters:  08/18/19 134/82  06/08/19 138/78  08/10/18 140/82

## 2019-08-18 NOTE — Assessment & Plan Note (Signed)
Resolved -  prostatitis treated. The pt refused Urology referral

## 2019-08-18 NOTE — Assessment & Plan Note (Signed)
labs

## 2019-08-18 NOTE — Assessment & Plan Note (Signed)
Testosterone labs Cialis prn

## 2019-08-18 NOTE — Progress Notes (Signed)
Subjective:  Patient ID: Dale Bishop, male    DOB: [birth year]  Age: 59 y.o. MRN: [id number]  CC: No chief complaint on file.   HPI Dale Bishop presents for hematuria f/u - trated for prostatitis C/o LBP irrad LLE Working at [employer] ED, ?low testosterone  Outpatient Medications Prior to Visit  Medication Sig Dispense Refill  . amLODipine (NORVASC) 10 MG tablet TAKE 1 TABLET (10 MG TOTAL) BY MOUTH DAILY. 90 tablet 2  . carvedilol (COREG) 12.5 MG tablet TAKE 1 TABLET BY MOUTH TWICE DAILY WITH MEALS 180 tablet 2  . cetirizine (ZYRTEC) 10 MG tablet Take 10 mg by mouth daily.    . Cholecalciferol 1.25 MG (50000 UT) capsule Take 1 capsule (50,000 Units total) by mouth once a week. 12 capsule 1  . clindamycin (CLEOCIN T) 1 % external solution Apply topically 2 (two) times daily. Apply to rash 30 mL 3  . losartan (COZAAR) 50 MG tablet TAKE 1 TABLET BY MOUTH ONCE DAILY 90 tablet 3  . rosuvastatin (CRESTOR) 10 MG tablet TAKE 1 TABLET BY MOUTH ONCE DAILY 90 tablet 3  . triamcinolone cream (KENALOG) 0.1 % APPLY TOPICALLY 2 TIMES DAILY. 30 g 0   No facility-administered medications prior to visit.    ROS: Review of Systems  Constitutional: Negative for appetite change, fatigue and unexpected weight change.  HENT: Negative for congestion, nosebleeds, sneezing, sore throat and trouble swallowing.   Eyes: Negative for itching and visual disturbance.  Respiratory: Negative for cough.   Cardiovascular: Negative for chest pain, palpitations and leg swelling.  Gastrointestinal: Negative for abdominal distention, blood in stool, diarrhea and nausea.  Genitourinary: Negative for frequency and hematuria.  Musculoskeletal: Negative for back pain, gait problem, joint swelling and neck pain.  Skin: Negative for rash.  Neurological: Negative for dizziness, tremors, speech difficulty and weakness.  Psychiatric/Behavioral: Negative for agitation, dysphoric mood and sleep disturbance. The patient  is not nervous/anxious.     Objective:  BP 134/82 (BP Location: Left Arm, Patient Position: Sitting, Cuff Size: Large)   Pulse 68   Temp 98 F (36.7 C) (Oral)   Ht 5\' 11"  (1.803 m)   Wt 269 lb (122 kg)   SpO2 96%   BMI 37.52 kg/m   BP Readings from Last 3 Encounters:  08/18/19 134/82  06/08/19 138/78  08/10/18 140/82    Wt Readings from Last 3 Encounters:  08/18/19 269 lb (122 kg)  06/08/19 269 lb (122 kg)  08/10/18 259 lb (117.5 kg)    Physical Exam Constitutional:      General: He is not in acute distress.    Appearance: He is well-developed.     Comments: NAD  Eyes:     Conjunctiva/sclera: Conjunctivae normal.     Pupils: Pupils are equal, round, and reactive to light.  Neck:     Thyroid: No thyromegaly.     Vascular: No JVD.  Cardiovascular:     Rate and Rhythm: Normal rate and regular rhythm.     Heart sounds: Normal heart sounds. No murmur. No friction rub. No gallop.   Pulmonary:     Effort: Pulmonary effort is normal. No respiratory distress.     Breath sounds: Normal breath sounds. No wheezing or rales.  Chest:     Chest wall: No tenderness.  Abdominal:     General: Bowel sounds are normal. There is no distension.     Palpations: Abdomen is soft. There is no mass.     Tenderness: There  is no abdominal tenderness. There is no guarding or rebound.  Musculoskeletal:        General: No tenderness. Normal range of motion.     Cervical back: Normal range of motion.  Lymphadenopathy:     Cervical: No cervical adenopathy.  Skin:    General: Skin is warm and dry.     Findings: No rash.  Neurological:     Mental Status: He is alert and oriented to person, place, and time.     Cranial Nerves: No cranial nerve deficit.     Motor: No abnormal muscle tone.     Coordination: Coordination normal.     Gait: Gait normal.     Deep Tendon Reflexes: Reflexes are normal and symmetric.  Psychiatric:        Behavior: Behavior normal.        Thought Content: Thought  content normal.        Judgment: Judgment normal.      Lab Results  Component Value Date   WBC 11.5 (H) 06/08/2019   HGB 14.4 06/08/2019   HCT 44.3 06/08/2019   PLT 344.0 06/08/2019   GLUCOSE 84 06/08/2019   CHOL 186 06/08/2019   TRIG 131.0 06/08/2019   HDL 44.10 06/08/2019   LDLCALC 115 (H) 06/08/2019   ALT 15 08/06/2017   AST 13 08/06/2017   NA 138 06/08/2019   K 4.0 06/08/2019   CL 103 06/08/2019   CREATININE 1.19 06/08/2019   BUN 13 06/08/2019   CO2 25 06/08/2019   TSH 2.34 06/08/2019   PSA 15.80 (H) 06/08/2019   HGBA1C 6.4 06/08/2019   MICROALBUR 3.6 (H) 06/08/2019    DG Knee Complete 4 Views Left  Result Date: 01/08/2018 CLINICAL DATA:  Ten days of medial knee pain without known injury. EXAM: LEFT KNEE - COMPLETE 4+ VIEW COMPARISON:  None in PACs FINDINGS: The bones are subjectively adequately mineralized. There is no acute or healing fracture. The joint spaces are reasonably well-maintained. There is beaking of the tibial spines. Small spurs arise from the articular margins of the medial femoral condyle and medial tibial plateau. There spurs arising from the articular margins of the patella. There is no joint effusion. IMPRESSION: There is no acute bony abnormality of the left knee. 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 Electronically Signed   By: David  Martinique M.D.   On: 01/08/2018 11:07    Assessment & Plan:   There are no diagnoses linked to this encounter.   No orders of the defined types were placed in this encounter.    Follow-up: No follow-ups on file.  Walker Kehr, MD

## 2019-08-19 ENCOUNTER — Other Ambulatory Visit: Payer: Self-pay | Admitting: Internal Medicine

## 2019-08-21 DIAGNOSIS — H524 Presbyopia: Secondary | ICD-10-CM | POA: Diagnosis not present

## 2019-08-21 DIAGNOSIS — H5213 Myopia, bilateral: Secondary | ICD-10-CM | POA: Diagnosis not present

## 2019-08-21 DIAGNOSIS — H52223 Regular astigmatism, bilateral: Secondary | ICD-10-CM | POA: Diagnosis not present

## 2019-08-23 ENCOUNTER — Other Ambulatory Visit: Payer: Self-pay | Admitting: Internal Medicine

## 2019-08-24 MED ORDER — TRAMADOL HCL 50 MG PO TABS: 50.0000 mg | ORAL_TABLET | Freq: Four times a day (QID) | ORAL | 0 refills | Status: AC | PRN

## 2019-08-24 MED FILL — traMADol HCL 50 MG TABS: 50 | 5 days supply | Qty: 40 | Fill #0

## 2019-09-13 ENCOUNTER — Other Ambulatory Visit: Payer: Self-pay | Admitting: Internal Medicine

## 2019-09-14 MED FILL — TADALAFIL 20 MG TABS: 20 | 30 days supply | Qty: 6 | Fill #1

## 2019-09-23 ENCOUNTER — Other Ambulatory Visit: Payer: Self-pay | Admitting: Internal Medicine

## 2019-09-23 MED FILL — ROSUVASTATIN CALCIUM 10 MG: 10 | 90 days supply | Qty: 90 | Fill #2

## 2019-09-23 MED FILL — LOSARTAN POTASSIUM 50 MG TA: 50 | 30 days supply | Qty: 30 | Fill #0

## 2019-09-23 MED FILL — CARVEDILOL 12.5 MG TABLET: 12.5 | 90 days supply | Qty: 180 | Fill #1

## 2019-10-12 ENCOUNTER — Other Ambulatory Visit: Payer: Self-pay | Admitting: Internal Medicine

## 2019-10-12 MED FILL — TADALAFIL 20 MG TABS: 20 | 30 days supply | Qty: 6 | Fill #2

## 2019-10-13 MED FILL — AMLODIPINE BESYLATE 10 MG T: 10 | 90 days supply | Qty: 90 | Fill #0

## 2019-10-13 MED FILL — CLINDAMYCIN HCL 300 MG CAPS: 300 | 7 days supply | Qty: 21 | Fill #0

## 2019-10-13 MED FILL — VITAMIN D3 50,000 UNITS CAP: 1.25 MG | 84 days supply | Qty: 12 | Fill #1

## 2019-11-01 ENCOUNTER — Other Ambulatory Visit: Payer: Self-pay | Admitting: Internal Medicine

## 2019-11-01 MED FILL — CLINDAMYCIN PH 1% SOLUTION: 1 | 30 days supply | Qty: 60 | Fill #0

## 2019-11-15 MED FILL — TADALAFIL 20 MG TABS: 20 | 30 days supply | Qty: 6 | Fill #3

## 2019-11-18 ENCOUNTER — Ambulatory Visit: Payer: 59 | Admitting: Internal Medicine

## 2019-12-06 MED FILL — LOSARTAN POTASSIUM 50 MG TA: 50 | 30 days supply | Qty: 30 | Fill #2

## 2019-12-15 MED FILL — TADALAFIL 20 MG TABS: 20 | 30 days supply | Qty: 6 | Fill #4

## 2020-01-20 ENCOUNTER — Ambulatory Visit: Payer: 59 | Admitting: Internal Medicine

## 2020-01-20 MED FILL — TADALAFIL 20 MG TABS: 20 | 30 days supply | Qty: 6 | Fill #5

## 2020-01-20 MED FILL — ROSUVASTATIN CALCIUM 10 MG: 10 | 90 days supply | Qty: 90 | Fill #3

## 2020-01-20 MED FILL — LOSARTAN POTASSIUM 50 MG TA: 50 | 30 days supply | Qty: 30 | Fill #3

## 2020-01-21 ENCOUNTER — Other Ambulatory Visit: Payer: Self-pay | Admitting: Internal Medicine

## 2020-01-21 MED FILL — TRIAMCINOLONE ACETONIDE 0.1: 0.1 | 7 days supply | Qty: 30 | Fill #0

## 2020-01-24 ENCOUNTER — Encounter: Payer: Self-pay | Admitting: Internal Medicine

## 2020-01-24 ENCOUNTER — Ambulatory Visit (INDEPENDENT_AMBULATORY_CARE_PROVIDER_SITE_OTHER): Payer: 59 | Admitting: Internal Medicine

## 2020-01-24 ENCOUNTER — Other Ambulatory Visit: Payer: Self-pay

## 2020-01-24 VITALS — BP 110/66 | HR 67 | Temp 97.6°F | Ht 71.0 in | Wt 258.0 lb

## 2020-01-24 DIAGNOSIS — M170 Bilateral primary osteoarthritis of knee: Secondary | ICD-10-CM

## 2020-01-24 DIAGNOSIS — E559 Vitamin D deficiency, unspecified: Secondary | ICD-10-CM | POA: Diagnosis not present

## 2020-01-24 DIAGNOSIS — M5430 Sciatica, unspecified side: Secondary | ICD-10-CM | POA: Insufficient documentation

## 2020-01-24 DIAGNOSIS — E118 Type 2 diabetes mellitus with unspecified complications: Secondary | ICD-10-CM | POA: Diagnosis not present

## 2020-01-24 DIAGNOSIS — I10 Essential (primary) hypertension: Secondary | ICD-10-CM | POA: Diagnosis not present

## 2020-01-24 DIAGNOSIS — R202 Paresthesia of skin: Secondary | ICD-10-CM

## 2020-01-24 DIAGNOSIS — L723 Sebaceous cyst: Secondary | ICD-10-CM | POA: Diagnosis not present

## 2020-01-24 DIAGNOSIS — M5432 Sciatica, left side: Secondary | ICD-10-CM | POA: Diagnosis not present

## 2020-01-24 DIAGNOSIS — M5412 Radiculopathy, cervical region: Secondary | ICD-10-CM | POA: Diagnosis not present

## 2020-01-24 DIAGNOSIS — M1712 Unilateral primary osteoarthritis, left knee: Secondary | ICD-10-CM | POA: Diagnosis not present

## 2020-01-24 MED ORDER — METHYLPREDNISOLONE ACETATE 40 MG/ML IJ SUSP
40.0000 mg | Freq: Once | INTRAMUSCULAR | Status: AC
  Administered 2020-01-24: 40 mg via INTRAMUSCULAR

## 2020-01-24 MED ORDER — METHYLPREDNISOLONE 4 MG PO TBPK: ORAL_TABLET | ORAL | 0 refills | Status: DC

## 2020-01-24 MED FILL — METHYLPREDNISOLONE 4 MG TBP: 4 | 6 days supply | Qty: 21 | Fill #0

## 2020-01-24 NOTE — Assessment & Plan Note (Signed)
Pain is worse L knee - will inject

## 2020-01-24 NOTE — Assessment & Plan Note (Signed)
Amlodipine, Coreg 

## 2020-01-24 NOTE — Assessment & Plan Note (Signed)
PT Medrol pack

## 2020-01-24 NOTE — Assessment & Plan Note (Signed)
Skin bx 

## 2020-01-24 NOTE — Progress Notes (Signed)
Subjective:  Patient ID: Dale Bishop, male    DOB: 09/28/1960  Age: 59 y.o. MRN: [id number]  CC: No chief complaint on file.   HPI Dale Bishop presents for a cyst, LBP C/o L arm weakness, L thumb numbness c 2 wks. He is R handed F/u HTN, DM  Outpatient Medications Prior to Visit  Medication Sig Dispense Refill  . amLODipine (NORVASC) 10 MG tablet TAKE 1 TABLET BY MOUTH ONCE DAILY 90 tablet 3  . carvedilol (COREG) 12.5 MG tablet TAKE 1 TABLET BY MOUTH TWICE DAILY WITH MEALS 180 tablet 2  . cetirizine (ZYRTEC) 10 MG tablet Take 10 mg by mouth daily.    . Cholecalciferol 1.25 MG (50000 UT) capsule Take 1 capsule (50,000 Units total) by mouth once a week. 12 capsule 1  . clindamycin (CLEOCIN T) 1 % external solution APPLY TO RASH TWICE DAILY 60 mL 3  . clindamycin (CLEOCIN) 300 MG capsule TAKE 1 CAPSULE BY MOUTH THREE TIMES DAILY FOR 7 DAYS 21 capsule 0  . losartan (COZAAR) 50 MG tablet TAKE 1 TABLET BY MOUTH ONCE DAILY 30 tablet 3  . rosuvastatin (CRESTOR) 10 MG tablet TAKE 1 TABLET BY MOUTH ONCE DAILY 90 tablet 3  . tadalafil (CIALIS) 20 MG tablet Take 0.5-1 tablets (10-20 mg total) by mouth every three (3) days as needed for erectile dysfunction. 10 tablet 5  . triamcinolone cream (KENALOG) 0.1 % APPLY TOPICALLY 2 TIMES DAILY. 30 g 0   No facility-administered medications prior to visit.    ROS: Review of Systems  Constitutional: Negative for appetite change, fatigue and unexpected weight change.  HENT: Negative for congestion, nosebleeds, sneezing, sore throat and trouble swallowing.   Eyes: Negative for itching and visual disturbance.  Respiratory: Negative for cough.   Cardiovascular: Negative for chest pain, palpitations and leg swelling.  Gastrointestinal: Negative for abdominal distention, blood in stool, diarrhea and nausea.  Genitourinary: Negative for frequency and hematuria.  Musculoskeletal: Positive for arthralgias, back pain and gait problem. Negative for joint  swelling and neck pain.  Skin: Negative for rash.  Neurological: Positive for weakness and numbness. Negative for dizziness, tremors and speech difficulty.  Psychiatric/Behavioral: Negative for agitation, dysphoric mood and sleep disturbance. The patient is not nervous/anxious.     Objective:  There were no vitals taken for this visit.  BP Readings from Last 3 Encounters:  08/18/19 134/82  06/08/19 138/78  08/10/18 140/82    Wt Readings from Last 3 Encounters:  08/18/19 269 lb (122 kg)  06/08/19 269 lb (122 kg)  08/10/18 259 lb (117.5 kg)    Physical Exam Constitutional:      General: He is not in acute distress.    Appearance: He is well-developed. He is obese. He is not ill-appearing.     Comments: NAD  Eyes:     Conjunctiva/sclera: Conjunctivae normal.     Pupils: Pupils are equal, round, and reactive to light.  Neck:     Thyroid: No thyromegaly.     Vascular: No JVD.  Cardiovascular:     Rate and Rhythm: Normal rate and regular rhythm.     Heart sounds: Normal heart sounds. No murmur heard.  No friction rub. No gallop.   Pulmonary:     Effort: Pulmonary effort is normal. No respiratory distress.     Breath sounds: Normal breath sounds. No wheezing or rales.  Chest:     Chest wall: No tenderness.  Abdominal:     General: Bowel sounds are normal. There  is no distension.     Palpations: Abdomen is soft. There is no mass.     Tenderness: There is no abdominal tenderness. There is no guarding or rebound.  Musculoskeletal:        General: Tenderness present. Normal range of motion.     Cervical back: Normal range of motion.  Lymphadenopathy:     Cervical: No cervical adenopathy.  Skin:    General: Skin is warm and dry.     Findings: No rash.  Neurological:     Mental Status: He is alert and oriented to person, place, and time.     Cranial Nerves: No cranial nerve deficit.     Motor: Weakness present. No abnormal muscle tone.     Coordination: Coordination  normal.     Gait: Gait normal.     Deep Tendon Reflexes: Reflexes are normal and symmetric.  Psychiatric:        Behavior: Behavior normal.        Thought Content: Thought content normal.        Judgment: Judgment normal.    LS tender Cyst - upper back L knee painful L arm - biceps 5-/5 weak CTS tests (-) B    Procedure Note :     Procedure : Joint Injection,L   knee   Indication:  Joint osteoarthritis with refractory  chronic pain.   Risks including unsuccessful procedure , bleeding, infection, bruising, skin atrophy, "steroid flare-up" and others were explained to the patient in detail as well as the benefits. Informed consent was obtained and signed.   Tthe patient was placed in a comfortable position. Lateral approach was used. Skin was prepped with Betadine and alcohol  and anesthetized a cooling spray. Then, a 5 cc syringe with a 1.5 inch long 25-gauge needle was used for a joint injection.. The needle was advanced  Into the knee joint cavity. I aspirated a small amount of intra-articular fluid to confirm correct placement of the needle and injected the joint with 5 mL of 2% lidocaine and 40 mg of Depo-Medrol .  Band-Aid was applied.   Tolerated well. Complications: None. Good pain relief following the procedure.   Lab Results  Component Value Date   WBC 11.5 (H) 06/08/2019   HGB 14.4 06/08/2019   HCT 44.3 06/08/2019   PLT 344.0 06/08/2019   GLUCOSE 108 (H) 08/18/2019   CHOL 186 06/08/2019   TRIG 131.0 06/08/2019   HDL 44.10 06/08/2019   LDLCALC 115 (H) 06/08/2019   ALT 15 08/06/2017   AST 13 08/06/2017   NA 139 08/18/2019   K 3.9 08/18/2019   CL 105 08/18/2019   CREATININE 1.23 08/18/2019   BUN 12 08/18/2019   CO2 24 08/18/2019   TSH 2.34 06/08/2019   PSA 15.80 (H) 06/08/2019   HGBA1C 6.4 06/08/2019   MICROALBUR 3.6 (H) 06/08/2019    DG Knee Complete 4 Views Left  Result Date: 01/08/2018 CLINICAL DATA:  Ten days of medial knee pain without known injury.  EXAM: LEFT KNEE - COMPLETE 4+ VIEW COMPARISON:  None in PACs FINDINGS: The bones are subjectively adequately mineralized. There is no acute or healing fracture. The joint spaces are reasonably well-maintained. There is beaking of the tibial spines. Small spurs arise from the articular margins of the medial femoral condyle and medial tibial plateau. There spurs arising from the articular margins of the patella. There is no joint effusion. IMPRESSION: There is no acute bony abnormality of the left knee. There are mild osteoarthritic  changes centered on the medial and patellofemoral compartments. Electronically Signed   By: David  Martinique M.D.   On: 01/08/2018 11:07    Assessment & Plan:    Walker Kehr, MD

## 2020-01-24 NOTE — Patient Instructions (Signed)
Epidermal Cyst  An epidermal cyst is a small, painless lump under your skin. The cyst contains a grayish-white, bad-smelling substance (keratin). Do not try to pop or open an epidermal cyst yourself. What are the causes?  A blocked hair follicle.  A hair that curls and re-enters the skin instead of growing straight out of the skin.  A blocked pore.  Irritated skin.  An injury to the skin.  Certain conditions that are passed along from parent to child (inherited).  Human papillomavirus (HPV).  Long-term sun damage to the skin. What increases the risk?  Having acne.  Being overweight.  Being 30-40 years old. What are the signs or symptoms? These cysts are usually harmless, but they can get infected. Symptoms of infection may include:  Redness.  Inflammation.  Tenderness.  Warmth.  Fever.  A grayish-white, bad-smelling substance drains from the cyst.  Pus drains from the cyst. How is this treated? In many cases, epidermal cysts go away on their own without treatment. If a cyst becomes infected, treatment may include:  Opening and draining the cyst, done by a doctor. After draining, you may need minor surgery to remove the rest of the cyst.  Antibiotic medicine.  Shots of medicines (steroids) that help to reduce inflammation.  Surgery to remove the cyst. Surgery may be done if the cyst: ? Becomes large. ? Bothers you. ? Has a chance of turning into cancer.  Do not try to open a cyst yourself. Follow these instructions at home:  Take over-the-counter and prescription medicines only as told by your doctor.  If you were prescribed an antibiotic medicine, take it it as told by your doctor. Do not stop using the antibiotic even if you start to feel better.  Keep the area around your cyst clean and dry.  Wear loose, dry clothing.  Avoid touching your cyst.  Check your cyst every day for signs of infection. Check for: ? Redness, swelling, or pain. ? Fluid  or blood. ? Warmth. ? Pus or a bad smell.  Keep all follow-up visits as told by your doctor. This is important. How is this prevented?  Wear clean, dry, clothing.  Avoid wearing tight clothing.  Keep your skin clean and dry. Take showers or baths every day. Contact a doctor if:  Your cyst has symptoms of infection.  Your condition does not improve or gets worse.  You have a cyst that looks different from other cysts you have had.  You have a fever. Get help right away if:  Redness spreads from the cyst into the area close by. Summary  An epidermal cyst is a sac made of skin tissue.  If a cyst becomes infected, treatment may include surgery to open and drain the cyst, or to remove it.  Take over-the-counter and prescription medicines only as told by your doctor.  Contact a doctor if your condition is not improving or is getting worse.  Keep all follow-up visits as told by your doctor. This is important. This information is not intended to replace advice given to you by your health care provider. Make sure you discuss any questions you have with your health care provider. Document Revised: 09/24/2018 Document Reviewed: 03/12/2018 Elsevier Patient Education  2020 [employer]  

## 2020-01-24 NOTE — Assessment & Plan Note (Signed)
Vit D 

## 2020-01-24 NOTE — Assessment & Plan Note (Signed)
Labs

## 2020-01-24 NOTE — Assessment & Plan Note (Addendum)
L arm/hand sx's Cerv MRI suggested Medrol pack po PT

## 2020-01-31 ENCOUNTER — Ambulatory Visit: Payer: 59 | Admitting: Internal Medicine

## 2020-01-31 ENCOUNTER — Other Ambulatory Visit: Payer: Self-pay | Admitting: Internal Medicine

## 2020-01-31 ENCOUNTER — Encounter: Payer: Self-pay | Admitting: Internal Medicine

## 2020-01-31 ENCOUNTER — Other Ambulatory Visit: Payer: Self-pay

## 2020-01-31 VITALS — BP 148/90 | HR 72 | Ht 71.0 in | Wt 259.0 lb

## 2020-01-31 DIAGNOSIS — L720 Epidermal cyst: Secondary | ICD-10-CM | POA: Diagnosis not present

## 2020-01-31 DIAGNOSIS — L723 Sebaceous cyst: Secondary | ICD-10-CM

## 2020-01-31 MED ORDER — METHYLPREDNISOLONE 4 MG PO TBPK: ORAL_TABLET | ORAL | 0 refills | Status: DC

## 2020-01-31 MED FILL — METHYLPREDNISOLONE 4 MG TBP: 4 | 6 days supply | Qty: 21 | Fill #0

## 2020-02-07 ENCOUNTER — Other Ambulatory Visit: Payer: Self-pay | Admitting: Internal Medicine

## 2020-02-07 MED FILL — AMLODIPINE BESYLATE 10 MG T: 10 | 90 days supply | Qty: 90 | Fill #1

## 2020-02-12 MED FILL — LOSARTAN POTASSIUM 50 MG TA: 50 | 90 days supply | Qty: 90 | Fill #0

## 2020-02-16 MED FILL — TADALAFIL 20 MG TABS: 20 | 30 days supply | Qty: 6 | Fill #6

## 2020-03-15 MED FILL — TADALAFIL 20 MG TABS: 20 | 30 days supply | Qty: 6 | Fill #7

## 2020-04-25 ENCOUNTER — Encounter: Payer: Self-pay | Admitting: Internal Medicine

## 2020-04-25 ENCOUNTER — Other Ambulatory Visit: Payer: Self-pay | Admitting: Internal Medicine

## 2020-04-25 ENCOUNTER — Ambulatory Visit (INDEPENDENT_AMBULATORY_CARE_PROVIDER_SITE_OTHER): Payer: 59 | Admitting: Internal Medicine

## 2020-04-25 ENCOUNTER — Other Ambulatory Visit: Payer: Self-pay

## 2020-04-25 DIAGNOSIS — E559 Vitamin D deficiency, unspecified: Secondary | ICD-10-CM

## 2020-04-25 DIAGNOSIS — I10 Essential (primary) hypertension: Secondary | ICD-10-CM

## 2020-04-25 DIAGNOSIS — E118 Type 2 diabetes mellitus with unspecified complications: Secondary | ICD-10-CM | POA: Diagnosis not present

## 2020-04-25 DIAGNOSIS — L2084 Intrinsic (allergic) eczema: Secondary | ICD-10-CM | POA: Diagnosis not present

## 2020-04-25 MED ORDER — TRAMADOL HCL 50 MG PO TABS: 50.0000 mg | ORAL_TABLET | Freq: Four times a day (QID) | ORAL | 1 refills | Status: DC | PRN

## 2020-04-25 MED ORDER — METHYLPREDNISOLONE 4 MG PO TBPK
ORAL_TABLET | ORAL | 0 refills | Status: DC
Start: 2020-04-25 — End: 2020-04-25

## 2020-04-25 MED ORDER — ROSUVASTATIN CALCIUM 10 MG PO TABS
10.0000 mg | ORAL_TABLET | Freq: Every day | ORAL | 3 refills | Status: DC
Start: 2020-04-25 — End: 2020-04-25

## 2020-04-25 MED ORDER — CARVEDILOL 12.5 MG PO TABS
12.5000 mg | ORAL_TABLET | Freq: Two times a day (BID) | ORAL | 3 refills | Status: DC
Start: 2020-04-25 — End: 2020-04-25

## 2020-04-25 MED ORDER — LOSARTAN POTASSIUM 50 MG PO TABS
50.0000 mg | ORAL_TABLET | Freq: Every day | ORAL | 3 refills | Status: DC
Start: 2020-04-25 — End: 2020-04-25

## 2020-04-25 MED ORDER — AMLODIPINE BESYLATE 10 MG PO TABS
10.0000 mg | ORAL_TABLET | Freq: Every day | ORAL | 3 refills | Status: DC
Start: 2020-04-25 — End: 2020-04-25

## 2020-04-25 MED FILL — METHYLPREDNISOLONE 4 MG TBP: 4 | 6 days supply | Qty: 21 | Fill #0

## 2020-04-25 MED FILL — ROSUVASTATIN CALCIUM 10 MG: 10 | 90 days supply | Qty: 90 | Fill #0

## 2020-04-25 MED FILL — LOSARTAN POTASSIUM 50 MG TA: 50 | 90 days supply | Qty: 90 | Fill #0

## 2020-04-25 MED FILL — CARVEDILOL 12.5 MG TABLET: 12.5 | 90 days supply | Qty: 180 | Fill #0

## 2020-04-25 MED FILL — AMLODIPINE BESYLATE 10 MG T: 10 | 90 days supply | Qty: 90 | Fill #0

## 2020-04-25 MED FILL — traMADol HCL 50 MG TABS: 50 | 25 days supply | Qty: 100 | Fill #0

## 2020-04-25 NOTE — Progress Notes (Signed)
Subjective:  Patient ID: Dale Bishop, male    DOB: [birth year]  Age: 59 y.o. MRN: [id number]  CC: Follow-up (3 month F/U)   HPI Dale Bishop presents for L knee pain, LBP, eczema Dale Bishop is working 2 jobs  Outpatient Medications Prior to Visit  Medication Sig Dispense Refill  . amLODipine (NORVASC) 10 MG tablet TAKE 1 TABLET BY MOUTH ONCE DAILY 90 tablet 3  . carvedilol (COREG) 12.5 MG tablet TAKE 1 TABLET BY MOUTH TWICE DAILY WITH MEALS 180 tablet 2  . cetirizine (ZYRTEC) 10 MG tablet Take 10 mg by mouth daily.    . clindamycin (CLEOCIN T) 1 % external solution APPLY TO RASH TWICE DAILY 60 mL 3  . losartan (COZAAR) 50 MG tablet TAKE 1 TABLET BY MOUTH ONCE DAILY 90 tablet 3  . rosuvastatin (CRESTOR) 10 MG tablet TAKE 1 TABLET BY MOUTH ONCE DAILY 90 tablet 3  . triamcinolone cream (KENALOG) 0.1 % APPLY TOPICALLY 2 TIMES DAILY. 30 g 0  . tadalafil (CIALIS) 20 MG tablet Take 0.5-1 tablets (10-20 mg total) by mouth every three (3) days as needed for erectile dysfunction. 10 tablet 5  . Cholecalciferol 1.25 MG (50000 UT) capsule Take 1 capsule (50,000 Units total) by mouth once a week. (Patient not taking: Reported on 04/25/2020) 12 capsule 1  . clindamycin (CLEOCIN) 300 MG capsule TAKE 1 CAPSULE BY MOUTH THREE TIMES DAILY FOR 7 DAYS 21 capsule 0  . ID NOW COVID-19 KIT See admin instructions. (Patient not taking: Reported on 04/25/2020)    . methylPREDNISolone (MEDROL DOSEPAK) 4 MG TBPK tablet As directed (Patient not taking: Reported on 04/25/2020) 21 tablet 0   No facility-administered medications prior to visit.    ROS: Review of Systems  Constitutional: Positive for fatigue. Negative for appetite change and unexpected weight change.  HENT: Negative for congestion, nosebleeds, sneezing, sore throat and trouble swallowing.   Eyes: Negative for itching and visual disturbance.  Respiratory: Negative for cough.   Cardiovascular: Negative for chest pain, palpitations and leg swelling.    Gastrointestinal: Negative for abdominal distention, blood in stool, diarrhea and nausea.  Genitourinary: Negative for frequency and hematuria.  Musculoskeletal: Positive for arthralgias, back pain and gait problem. Negative for joint swelling and neck pain.  Skin: Positive for color change and rash.  Neurological: Negative for dizziness, tremors, speech difficulty and weakness.  Psychiatric/Behavioral: Negative for agitation, dysphoric mood and sleep disturbance. The patient is not nervous/anxious.     Objective:  BP 140/82 (BP Location: Left Arm)   Pulse 72   Temp 98.2 F (36.8 C) (Oral)   Wt 254 lb 6.4 oz (115.4 kg)   SpO2 97%   BMI 35.48 kg/m   BP Readings from Last 3 Encounters:  04/25/20 140/82  01/31/20 (!) 148/90  01/24/20 110/66    Wt Readings from Last 3 Encounters:  04/25/20 254 lb 6.4 oz (115.4 kg)  01/31/20 259 lb (117.5 kg)  01/24/20 258 lb (117 kg)    Physical Exam Constitutional:      General: He is not in acute distress.    Appearance: He is well-developed.     Comments: NAD  Eyes:     Conjunctiva/sclera: Conjunctivae normal.     Pupils: Pupils are equal, round, and reactive to light.  Neck:     Thyroid: No thyromegaly.     Vascular: No JVD.  Cardiovascular:     Rate and Rhythm: Normal rate and regular rhythm.     Heart sounds: Normal heart sounds.  No murmur heard.  No friction rub. No gallop.   Pulmonary:     Effort: Pulmonary effort is normal. No respiratory distress.     Breath sounds: Normal breath sounds. No wheezing or rales.  Chest:     Chest wall: No tenderness.  Abdominal:     General: Bowel sounds are normal. There is no distension.     Palpations: Abdomen is soft. There is no mass.     Tenderness: There is no abdominal tenderness. There is no guarding or rebound.  Musculoskeletal:        General: Tenderness present. Normal range of motion.     Cervical back: Normal range of motion.  Lymphadenopathy:     Cervical: No cervical  adenopathy.  Skin:    General: Skin is warm and dry.     Findings: Rash present.  Neurological:     Mental Status: He is alert and oriented to person, place, and time.     Cranial Nerves: No cranial nerve deficit.     Motor: No abnormal muscle tone.     Coordination: Coordination normal.     Gait: Gait normal.     Deep Tendon Reflexes: Reflexes are normal and symmetric.  Psychiatric:        Behavior: Behavior normal.        Thought Content: Thought content normal.        Judgment: Judgment normal.   eczema rash UE, LE, trunk, face Knees - tender w/ROM  Lab Results  Component Value Date   WBC 11.5 (H) 06/08/2019   HGB 14.4 06/08/2019   HCT 44.3 06/08/2019   PLT 344.0 06/08/2019   GLUCOSE 108 (H) 08/18/2019   CHOL 186 06/08/2019   TRIG 131.0 06/08/2019   HDL 44.10 06/08/2019   LDLCALC 115 (H) 06/08/2019   ALT 15 08/06/2017   AST 13 08/06/2017   NA 139 08/18/2019   K 3.9 08/18/2019   CL 105 08/18/2019   CREATININE 1.23 08/18/2019   BUN 12 08/18/2019   CO2 24 08/18/2019   TSH 2.34 06/08/2019   PSA 15.80 (H) 06/08/2019   HGBA1C 6.4 06/08/2019   MICROALBUR 3.6 (H) 06/08/2019    DG Knee Complete 4 Views Left  Result Date: 01/08/2018 CLINICAL DATA:  Ten days of medial knee pain without known injury. EXAM: LEFT KNEE - COMPLETE 4+ VIEW COMPARISON:  None in PACs FINDINGS: The bones are subjectively adequately mineralized. There is no acute or healing fracture. The joint spaces are reasonably well-maintained. There is beaking of the tibial spines. Small spurs arise from the articular margins of the medial femoral condyle and medial tibial plateau. There spurs arising from the articular margins of the patella. There is no joint effusion. IMPRESSION: There is no acute bony abnormality of the left knee. 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 Electronically Signed   By: David  Martinique M.D.   On: 01/08/2018 11:07    Assessment &  Plan:    Follow-up: No follow-ups on file.  Walker Kehr, MD

## 2020-04-25 NOTE — Assessment & Plan Note (Signed)
Worse SebaMed soap Triamcinolone cream Claritin

## 2020-04-25 NOTE — Assessment & Plan Note (Signed)
Labs

## 2020-04-25 NOTE — Assessment & Plan Note (Signed)
Amlodipine, Coreg, Losartan

## 2020-04-25 NOTE — Patient Instructions (Signed)
  SebaMed soap    

## 2020-04-25 NOTE — Assessment & Plan Note (Signed)
Vit D 

## 2020-04-26 ENCOUNTER — Other Ambulatory Visit: Payer: Self-pay

## 2020-04-26 ENCOUNTER — Other Ambulatory Visit: Payer: Self-pay | Admitting: Internal Medicine

## 2020-04-26 MED ORDER — TADALAFIL 20 MG PO TABS
10.0000 mg | ORAL_TABLET | ORAL | 5 refills | Status: DC | PRN
Start: 2020-04-26 — End: 2020-04-26

## 2020-04-26 MED FILL — TADALAFIL 20 MG TABS: 20 | 30 days supply | Qty: 6 | Fill #8

## 2020-05-01 ENCOUNTER — Encounter: Payer: Self-pay | Admitting: Internal Medicine

## 2020-05-25 MED FILL — TADALAFIL 20 MG TABS: 20 | 30 days supply | Qty: 6 | Fill #9

## 2020-07-10 MED FILL — TADALAFIL 20 MG TABS: 20 | 30 days supply | Qty: 6 | Fill #0

## 2020-07-16 ENCOUNTER — Other Ambulatory Visit: Payer: Self-pay

## 2020-07-25 ENCOUNTER — Ambulatory Visit: Payer: 59 | Admitting: Internal Medicine

## 2020-08-02 ENCOUNTER — Other Ambulatory Visit: Payer: Self-pay | Admitting: Internal Medicine

## 2020-08-02 ENCOUNTER — Ambulatory Visit: Payer: 59 | Admitting: Internal Medicine

## 2020-08-02 MED ORDER — TRIAMCINOLONE ACETONIDE 0.1 % EX CREA
TOPICAL_CREAM | Freq: Two times a day (BID) | CUTANEOUS | 0 refills | Status: DC | PRN
Start: 2020-08-02 — End: 2020-08-02

## 2020-08-02 MED FILL — TRIAMCINOLONE 0.1% CREAM: 0.1 | 15 days supply | Qty: 30 | Fill #0

## 2020-08-02 NOTE — Telephone Encounter (Signed)
Possible duplicate 

## 2020-08-16 ENCOUNTER — Ambulatory Visit: Payer: 59 | Admitting: Internal Medicine

## 2020-08-18 MED FILL — TADALAFIL 20 MG TABS: 20 | 30 days supply | Qty: 6 | Fill #1

## 2020-09-05 ENCOUNTER — Other Ambulatory Visit (HOSPITAL_BASED_OUTPATIENT_CLINIC_OR_DEPARTMENT_OTHER): Payer: Self-pay

## 2020-09-22 MED FILL — Losartan Potassium Tab 50 MG: ORAL | 90 days supply | Qty: 90 | Fill #0 | Status: AC

## 2020-09-23 ENCOUNTER — Other Ambulatory Visit (HOSPITAL_COMMUNITY): Payer: Self-pay

## 2020-09-27 ENCOUNTER — Other Ambulatory Visit (HOSPITAL_COMMUNITY): Payer: Self-pay

## 2020-09-27 MED FILL — Tadalafil Tab 20 MG: ORAL | 30 days supply | Qty: 6 | Fill #0 | Status: AC

## 2020-10-25 ENCOUNTER — Other Ambulatory Visit (INDEPENDENT_AMBULATORY_CARE_PROVIDER_SITE_OTHER): Payer: 59

## 2020-10-25 DIAGNOSIS — E118 Type 2 diabetes mellitus with unspecified complications: Secondary | ICD-10-CM | POA: Diagnosis not present

## 2020-10-25 DIAGNOSIS — R202 Paresthesia of skin: Secondary | ICD-10-CM

## 2020-10-25 DIAGNOSIS — I10 Essential (primary) hypertension: Secondary | ICD-10-CM | POA: Diagnosis not present

## 2020-10-25 DIAGNOSIS — E559 Vitamin D deficiency, unspecified: Secondary | ICD-10-CM

## 2020-10-25 LAB — HEMOGLOBIN A1C: Hgb A1c MFr Bld: 6.3 % (ref 4.6–6.5)

## 2020-10-25 LAB — CBC WITH DIFFERENTIAL/PLATELET
Basophils Absolute: 0 10*3/uL (ref 0.0–0.1)
Basophils Relative: 0.8 % (ref 0.0–3.0)
Eosinophils Absolute: 0.1 10*3/uL (ref 0.0–0.7)
Eosinophils Relative: 2.9 % (ref 0.0–5.0)
HCT: 42.7 % (ref 39.0–52.0)
Hemoglobin: 14 g/dL (ref 13.0–17.0)
Lymphocytes Relative: 36.1 % (ref 12.0–46.0)
Lymphs Abs: 1.6 10*3/uL (ref 0.7–4.0)
MCHC: 32.8 g/dL (ref 30.0–36.0)
MCV: 82.1 fl (ref 78.0–100.0)
Monocytes Absolute: 0.5 10*3/uL (ref 0.1–1.0)
Monocytes Relative: 12.7 % — ABNORMAL HIGH (ref 3.0–12.0)
Neutro Abs: 2 10*3/uL (ref 1.4–7.7)
Neutrophils Relative %: 47.5 % (ref 43.0–77.0)
Platelets: 240 10*3/uL (ref 150.0–400.0)
RBC: 5.2 Mil/uL (ref 4.22–5.81)
RDW: 14.5 % (ref 11.5–15.5)
WBC: 4.3 10*3/uL (ref 4.0–10.5)

## 2020-10-25 LAB — COMPREHENSIVE METABOLIC PANEL
ALT: 15 U/L (ref 0–53)
AST: 14 U/L (ref 0–37)
Albumin: 4.2 g/dL (ref 3.5–5.2)
Alkaline Phosphatase: 49 U/L (ref 39–117)
BUN: 12 mg/dL (ref 6–23)
CO2: 23 mEq/L (ref 19–32)
Calcium: 9.4 mg/dL (ref 8.4–10.5)
Chloride: 106 mEq/L (ref 96–112)
Creatinine, Ser: 1.18 mg/dL (ref 0.40–1.50)
GFR: 67.96 mL/min (ref >60.00)
Glucose, Bld: 119 mg/dL — ABNORMAL HIGH (ref 70–99)
Potassium: 3.7 mEq/L (ref 3.5–5.1)
Sodium: 140 mEq/L (ref 135–145)
Total Bilirubin: 0.5 mg/dL (ref 0.2–1.2)
Total Protein: 7.6 g/dL (ref 6.0–8.3)

## 2020-10-25 LAB — VITAMIN B12: Vitamin B-12: 222 pg/mL (ref 211–911)

## 2020-10-25 LAB — VITAMIN D 25 HYDROXY (VIT D DEFICIENCY, FRACTURES): VITD: 22.67 ng/mL — ABNORMAL LOW (ref 30.00–100.00)

## 2020-10-25 LAB — TSH: TSH: 2.2 u[IU]/mL (ref 0.35–4.50)

## 2020-10-25 NOTE — Addendum Note (Signed)
Addended by: Octavio Manns E on: 10/25/2020 07:43 AM   Modules accepted: Orders

## 2020-10-25 NOTE — Addendum Note (Signed)
Addended by: Piya Mesch E on: 10/25/2020 07:41 AM   Modules accepted: Orders  

## 2020-10-25 NOTE — Addendum Note (Signed)
Addended by: Oluwanifemi Petitti E on: 10/25/2020 07:41 AM   Modules accepted: Orders  

## 2020-10-25 NOTE — Addendum Note (Signed)
Addended by: Delayni Streed E on: 10/25/2020 07:41 AM   Modules accepted: Orders  

## 2020-10-25 NOTE — Addendum Note (Signed)
Addended by: Octavio Manns E on: 10/25/2020 07:42 AM   Modules accepted: Orders

## 2020-10-25 NOTE — Addendum Note (Signed)
Addended by: Cathern Tahir E on: 10/25/2020 07:41 AM   Modules accepted: Orders  

## 2020-10-25 NOTE — Addendum Note (Signed)
Addended by: Octavio Manns E on: 10/25/2020 07:41 AM   Modules accepted: Orders

## 2020-10-26 ENCOUNTER — Other Ambulatory Visit (HOSPITAL_COMMUNITY): Payer: Self-pay

## 2020-10-26 MED FILL — Rosuvastatin Calcium Tab 10 MG: ORAL | 90 days supply | Qty: 90 | Fill #0 | Status: AC

## 2020-10-27 ENCOUNTER — Other Ambulatory Visit (HOSPITAL_COMMUNITY): Payer: Self-pay

## 2020-10-27 ENCOUNTER — Other Ambulatory Visit: Payer: Self-pay | Admitting: Internal Medicine

## 2020-10-27 MED ORDER — VITAMIN D (ERGOCALCIFEROL) 1.25 MG (50000 UNIT) PO CAPS
50000.0000 [IU] | ORAL_CAPSULE | ORAL | 0 refills | Status: DC
  Filled 2020-10-27: qty 8, 56d supply, fill #0

## 2020-10-27 MED FILL — Tadalafil Tab 20 MG: ORAL | 30 days supply | Qty: 6 | Fill #1 | Status: AC

## 2020-10-28 ENCOUNTER — Other Ambulatory Visit (HOSPITAL_COMMUNITY): Payer: Self-pay

## 2020-10-30 ENCOUNTER — Other Ambulatory Visit (HOSPITAL_COMMUNITY): Payer: Self-pay

## 2020-11-02 ENCOUNTER — Other Ambulatory Visit (HOSPITAL_COMMUNITY): Payer: Self-pay

## 2020-11-09 ENCOUNTER — Telehealth: Payer: 59 | Admitting: Internal Medicine

## 2020-11-17 ENCOUNTER — Other Ambulatory Visit (HOSPITAL_COMMUNITY): Payer: Self-pay

## 2020-11-17 MED FILL — Amlodipine Besylate Tab 10 MG (Base Equivalent): ORAL | 90 days supply | Qty: 90 | Fill #0 | Status: AC

## 2020-11-21 ENCOUNTER — Other Ambulatory Visit: Payer: Self-pay

## 2020-11-22 ENCOUNTER — Other Ambulatory Visit (HOSPITAL_COMMUNITY): Payer: Self-pay

## 2020-11-22 ENCOUNTER — Other Ambulatory Visit: Payer: Self-pay

## 2020-11-22 ENCOUNTER — Ambulatory Visit (INDEPENDENT_AMBULATORY_CARE_PROVIDER_SITE_OTHER): Payer: 59 | Admitting: Internal Medicine

## 2020-11-22 ENCOUNTER — Encounter: Payer: Self-pay | Admitting: Internal Medicine

## 2020-11-22 VITALS — BP 148/78 | HR 96 | Temp 98.4°F | Ht 71.0 in | Wt 257.8 lb

## 2020-11-22 DIAGNOSIS — Z0000 Encounter for general adult medical examination without abnormal findings: Secondary | ICD-10-CM | POA: Diagnosis not present

## 2020-11-22 DIAGNOSIS — E118 Type 2 diabetes mellitus with unspecified complications: Secondary | ICD-10-CM

## 2020-11-22 DIAGNOSIS — E559 Vitamin D deficiency, unspecified: Secondary | ICD-10-CM

## 2020-11-22 DIAGNOSIS — I10 Essential (primary) hypertension: Secondary | ICD-10-CM

## 2020-11-22 DIAGNOSIS — M1712 Unilateral primary osteoarthritis, left knee: Secondary | ICD-10-CM

## 2020-11-22 MED ORDER — TADALAFIL 20 MG PO TABS
ORAL_TABLET | ORAL | 5 refills | Status: DC
  Filled 2020-11-22: qty 6, 30d supply, fill #0
  Filled 2021-01-01: qty 6, 30d supply, fill #1
  Filled 2021-02-01: qty 6, 30d supply, fill #2
  Filled 2021-03-05: qty 6, 30d supply, fill #3
  Filled 2021-04-04: qty 6, 30d supply, fill #4
  Filled 2021-05-13: qty 6, 30d supply, fill #5
  Filled 2021-06-19: qty 6, 30d supply, fill #6
  Filled 2021-07-21: qty 6, 30d supply, fill #7
  Filled 2021-08-20: qty 6, 30d supply, fill #8
  Filled 2021-09-22: qty 6, 30d supply, fill #9

## 2020-11-22 MED ORDER — ROSUVASTATIN CALCIUM 10 MG PO TABS
ORAL_TABLET | Freq: Every day | ORAL | 3 refills | Status: DC
  Filled 2020-11-22: qty 90, fill #0
  Filled 2021-01-29: qty 90, 90d supply, fill #0
  Filled 2021-05-13: qty 90, 90d supply, fill #1
  Filled 2021-08-23: qty 90, 90d supply, fill #2

## 2020-11-22 MED ORDER — VITAMIN D3 50 MCG (2000 UT) PO CAPS: 2000.0000 [IU] | ORAL_CAPSULE | Freq: Every day | ORAL | 3 refills | Status: DC

## 2020-11-22 MED ORDER — LOSARTAN POTASSIUM 50 MG PO TABS
ORAL_TABLET | Freq: Every day | ORAL | 3 refills | Status: DC
  Filled 2020-11-22: qty 90, fill #0
  Filled 2021-01-08: qty 90, 90d supply, fill #0
  Filled 2021-04-11: qty 90, 90d supply, fill #1
  Filled 2021-07-24: qty 90, 90d supply, fill #2
  Filled 2021-11-10: qty 90, 90d supply, fill #3

## 2020-11-22 MED ORDER — CARVEDILOL 12.5 MG PO TABS
ORAL_TABLET | Freq: Two times a day (BID) | ORAL | 3 refills | Status: DC
  Filled 2020-11-22: qty 180, 90d supply, fill #0
  Filled 2021-08-23: qty 180, 90d supply, fill #1

## 2020-11-22 MED ORDER — MELOXICAM 15 MG PO TABS
15.0000 mg | ORAL_TABLET | Freq: Every day | ORAL | 1 refills | Status: DC
  Filled 2020-11-22: qty 90, 90d supply, fill #0
  Filled 2021-02-12: qty 90, 90d supply, fill #1

## 2020-11-22 MED ORDER — AMLODIPINE BESYLATE 10 MG PO TABS
ORAL_TABLET | Freq: Every day | ORAL | 3 refills | Status: DC
  Filled 2020-11-22: qty 90, fill #0
  Filled 2021-02-12: qty 90, 90d supply, fill #0
  Filled 2021-05-31: qty 90, 90d supply, fill #1
  Filled 2021-09-16: qty 90, 90d supply, fill #2

## 2020-11-22 MED ORDER — B COMPLEX PLUS PO TABS: 1.0000 | ORAL_TABLET | Freq: Every day | ORAL | 3 refills | Status: DC

## 2020-11-22 MED ORDER — TRIAMCINOLONE ACETONIDE 0.1 % EX CREA
TOPICAL_CREAM | CUTANEOUS | 0 refills | Status: DC
  Filled 2020-11-22: qty 30, 30d supply, fill #0

## 2020-11-22 NOTE — Assessment & Plan Note (Addendum)
L knee pain is worse - Ortho ref Meloxicam po pc prn  Potential benefits of a long term NSAIDs use as well as potential risks  and complications were explained to the patient and were aknowledged.

## 2020-11-22 NOTE — Assessment & Plan Note (Signed)
We discussed age appropriate health related issues, including available/recomended screening tests and vaccinations. We discussed a need for adhering to healthy diet and exercise. Labs/EKG were reviewed/ordered. All questions were answered. Colon 4/17 due in 2027 cardiac CT scan for calcium scoring offered

## 2020-11-22 NOTE — Assessment & Plan Note (Signed)
Check A1c On diet 

## 2020-11-22 NOTE — Assessment & Plan Note (Signed)
On Amlodipine, Coreg, Losartan 

## 2020-11-22 NOTE — Progress Notes (Signed)
Subjective:  Patient ID: Dale Bishop, male    DOB: 1960-09-06  Age: 60 y.o. MRN: [id number]  CC: Annual Exam (C/o knee pain & skin rash)   HPI Dale Bishop presents for a well exam C/o L knee pain - severe pain  Outpatient Medications Prior to Visit  Medication Sig Dispense Refill   amLODipine (NORVASC) 10 MG tablet TAKE 1 TABLET BY MOUTH ONCE A DAY 90 tablet 3   carvedilol (COREG) 12.5 MG tablet TAKE 1 TABLET BY MOUTH TWO TIMES DAILY WITH MEALS 180 tablet 3   cetirizine (ZYRTEC) 10 MG tablet Take 10 mg by mouth daily.     losartan (COZAAR) 50 MG tablet TAKE 1 TABLET BY MOUTH ONCE A DAY 90 tablet 3   rosuvastatin (CRESTOR) 10 MG tablet TAKE 1 TABLET BY MOUTH ONCE A DAY 90 tablet 3   tadalafil (CIALIS) 20 MG tablet TAKE 1/2-1 TABLET (10-20 MG TOTAL) BY MOUTH EVERY THREE DAYS AS NEEDED FOR ERECTILE DYSFUNCTION. 10 tablet 5   triamcinolone (KENALOG) 0.1 % APPLY TOPICALLY TWICE DAILY AS NEEDED. 30 g 0   methylPREDNISolone (MEDROL DOSEPAK) 4 MG TBPK tablet TAKE 6 TABLETS BY MOUTH ON DAY 1 THEN DECREASE BY 1 TABLET DAILY UNTIL FINISHED (6-5-4-3-2-1) (Patient not taking: Reported on 11/22/2020) 21 each 0   Vitamin D, Ergocalciferol, (DRISDOL) 1.25 MG (50000 UNIT) CAPS capsule Take 1 capsule (50,000 Units total) by mouth every 7 (seven) days. (Patient not taking: Reported on 11/22/2020) 8 capsule 0   No facility-administered medications prior to visit.    ROS: Review of Systems  Constitutional:  Positive for unexpected weight change. Negative for appetite change and fatigue.  HENT:  Negative for congestion, nosebleeds, sneezing, sore throat and trouble swallowing.   Eyes:  Negative for itching and visual disturbance.  Respiratory:  Negative for cough.   Cardiovascular:  Negative for chest pain, palpitations and leg swelling.  Gastrointestinal:  Negative for abdominal distention, blood in stool, diarrhea and nausea.  Genitourinary:  Negative for frequency and hematuria.  Musculoskeletal:   Positive for arthralgias and gait problem. Negative for back pain, joint swelling and neck pain.  Skin:  Negative for rash.  Neurological:  Negative for dizziness, tremors, speech difficulty and weakness.  Psychiatric/Behavioral:  Negative for agitation, dysphoric mood and sleep disturbance. The patient is not nervous/anxious.    Objective:  BP (!) 148/78 (BP Location: Left Arm)   Pulse 96   Temp 98.4 F (36.9 C) (Oral)   Ht 5\' 11"  (1.803 m)   Wt 257 lb 12.8 oz (116.9 kg)   SpO2 96%   BMI 35.96 kg/m   BP Readings from Last 3 Encounters:  11/22/20 (!) 148/78  04/25/20 140/82  01/31/20 (!) 148/90    Wt Readings from Last 3 Encounters:  11/22/20 257 lb 12.8 oz (116.9 kg)  04/25/20 254 lb 6.4 oz (115.4 kg)  01/31/20 259 lb (117.5 kg)    Physical Exam Constitutional:      General: He is not in acute distress.    Appearance: He is well-developed. He is obese.     Comments: NAD  Eyes:     Conjunctiva/sclera: Conjunctivae normal.     Pupils: Pupils are equal, round, and reactive to light.  Neck:     Thyroid: No thyromegaly.     Vascular: No JVD.  Cardiovascular:     Rate and Rhythm: Normal rate and regular rhythm.     Heart sounds: Normal heart sounds. No murmur heard.   No friction rub.  No gallop.  Pulmonary:     Effort: Pulmonary effort is normal. No respiratory distress.     Breath sounds: Normal breath sounds. No wheezing or rales.  Chest:     Chest wall: No tenderness.  Abdominal:     General: Bowel sounds are normal. There is no distension.     Palpations: Abdomen is soft. There is no mass.     Tenderness: There is no abdominal tenderness. There is no guarding or rebound.  Musculoskeletal:        General: Tenderness present. Normal range of motion.     Cervical back: Normal range of motion.  Lymphadenopathy:     Cervical: No cervical adenopathy.  Skin:    General: Skin is warm and dry.     Findings: No rash.  Neurological:     Mental Status: He is alert and  oriented to person, place, and time.     Cranial Nerves: No cranial nerve deficit.     Motor: No abnormal muscle tone.     Coordination: Coordination normal.     Gait: Gait normal.     Deep Tendon Reflexes: Reflexes are normal and symmetric.  Psychiatric:        Behavior: Behavior normal.        Thought Content: Thought content normal.        Judgment: Judgment normal.   Knees w/pain L>>R  Lab Results  Component Value Date   WBC 4.3 10/25/2020   HGB 14.0 10/25/2020   HCT 42.7 10/25/2020   PLT 240.0 10/25/2020   GLUCOSE 119 (H) 10/25/2020   CHOL 186 06/08/2019   TRIG 131.0 06/08/2019   HDL 44.10 06/08/2019   LDLCALC 115 (H) 06/08/2019   ALT 15 10/25/2020   AST 14 10/25/2020   NA 140 10/25/2020   K 3.7 10/25/2020   CL 106 10/25/2020   CREATININE 1.18 10/25/2020   BUN 12 10/25/2020   CO2 23 10/25/2020   TSH 2.20 10/25/2020   PSA 15.80 (H) 06/08/2019   HGBA1C 6.3 10/25/2020   MICROALBUR 3.6 (H) 06/08/2019    DG Knee Complete 4 Views Left  Result Date: 01/08/2018 CLINICAL DATA:  Ten days of medial knee pain without known injury. EXAM: LEFT KNEE - COMPLETE 4+ VIEW COMPARISON:  None in PACs FINDINGS: The bones are subjectively adequately mineralized. There is no acute or healing fracture. The joint spaces are reasonably well-maintained. There is beaking of the tibial spines. Small spurs arise from the articular margins of the medial femoral condyle and medial tibial plateau. There spurs arising from the articular margins of the patella. There is no joint effusion. IMPRESSION: There is no acute bony abnormality of the left knee. 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 Electronically Signed   By: David  Martinique M.D.   On: 01/08/2018 11:07    Assessment & Plan:     Walker Kehr, MD

## 2020-11-22 NOTE — Patient Instructions (Addendum)
Normal BP <130/85  Cardiac CT calcium scoring test $99 Tel # is [phone]   Computed tomography, more commonly known as a CT or CAT scan, is a diagnostic medical imaging test. Like traditional x-rays, it produces multiple images or pictures of the inside of the body. The cross-sectional images generated during a CT scan can be reformatted in multiple planes. They can even generate three-dimensional images. These images can be viewed on a computer monitor, printed on film or by a 3D printer, or transferred to a CD or DVD. CT images of internal organs, bones, soft tissue and blood vessels provide greater detail than traditional x-rays, particularly of soft tissues and blood vessels. A cardiac CT scan for coronary calcium is a non-invasive way of obtaining information about the presence, location and extent of calcified plaque in the coronary arteries--the vessels that supply oxygen-containing blood to the heart muscle. Calcified plaque results when there is a build-up of fat and other substances under the inner layer of the artery. This material can calcify which signals the presence of atherosclerosis, a disease of the vessel wall, also called coronary artery disease (CAD). People with this disease have an increased risk for heart attacks. In addition, over time, progression of plaque build up (CAD) can narrow the arteries or even close off blood flow to the heart. The result may be chest pain, sometimes called "angina," or a heart attack. Because calcium is a marker of CAD, the amount of calcium detected on a cardiac CT scan is a helpful prognostic tool. The findings on cardiac CT are expressed as a calcium score. Another name for this test is coronary artery calcium scoring.  What are some common uses of the procedure? The goal of cardiac CT scan for calcium scoring is to determine if CAD is present and to what extent, even if there are no symptoms. It is a screening study that may be recommended by  a physician for patients with risk factors for CAD but no clinical symptoms. The major risk factors for CAD are: . high blood cholesterol levels  . family history of heart attacks  . diabetes  . high blood pressure  . cigarette smoking  . overweight or obese  . physical inactivity   A negative cardiac CT scan for calcium scoring shows no calcification within the coronary arteries. This suggests that CAD is absent or so minimal it cannot be seen by this technique. The chance of having a heart attack over the next two to five years is very low under these circumstances. A positive test means that CAD is present, regardless of whether or not the patient is experiencing any symptoms. The amount of calcification--expressed as the calcium score--may help to predict the likelihood of a myocardial infarction (heart attack) in the coming years and helps your medical doctor or cardiologist decide whether the patient may need to take preventive medicine or undertake other measures such as diet and exercise to lower the risk for heart attack. The extent of CAD is graded according to your calcium score:  Calcium Score  Presence of CAD (coronary artery disease)  0 No evidence of CAD   1-10 Minimal evidence of CAD  11-100 Mild evidence of CAD  101-400 Moderate evidence of CAD  Over 400 Extensive evidence of CAD   Coronary artery calcium (CAC) score is a strong predictor of incident coronary heart disease (CHD) and provides predictive information beyond traditional risk factors. CAC scoring is reasonable to use in the decision to withhold,  postpone, or initiate statin therapy in intermediate-risk or selected borderline-risk asymptomatic adults (age 6-75 years and LDL-C >=70 to <190 mg/dL) who do not have diabetes or established atherosclerotic cardiovascular disease (ASCVD).* In intermediate-risk (10-year ASCVD risk >=7.5% to <20%) adults or selected borderline-risk (10-year ASCVD risk >=5% to <7.5%)  adults in whom a CAC score is measured for the purpose of making a treatment decision the following recommendations have been made:   If CAC=0, it is reasonable to withhold statin therapy and reassess in 5 to 10 years, as long as higher risk conditions are absent (diabetes mellitus, family history of premature CHD in first degree relatives (males <55 years; females <65 years), cigarette smoking, or LDL >=190 mg/dL).   If CAC is 1 to 99, it is reasonable to initiate statin therapy for patients >=49 years of age.   If CAC is >=100 or >=75th percentile, it is reasonable to initiate statin therapy at any age.   Cardiology referral should be considered for patients with CAC scores >=400 or >=75th percentile.   *2018 AHA/ACC/AACVPR/AAPA/ABC/ACPM/ADA/AGS/APhA/ASPC/NLA/PCNA Guideline on the Management of Blood Cholesterol: A Report of the American College of Cardiology/American Heart Association Task Force on Clinical Practice Guidelines. J Am Coll Cardiol. 2019;73(24):3168-3209.   Earwax removing kit

## 2020-11-24 ENCOUNTER — Other Ambulatory Visit (HOSPITAL_COMMUNITY): Payer: Self-pay

## 2020-11-28 NOTE — Assessment & Plan Note (Signed)
Cont w/Vit D 

## 2020-12-01 ENCOUNTER — Other Ambulatory Visit (HOSPITAL_COMMUNITY): Payer: Self-pay

## 2020-12-04 ENCOUNTER — Other Ambulatory Visit (HOSPITAL_COMMUNITY): Payer: Self-pay

## 2020-12-12 ENCOUNTER — Ambulatory Visit: Payer: 59 | Admitting: Orthopaedic Surgery

## 2021-01-02 ENCOUNTER — Other Ambulatory Visit (HOSPITAL_COMMUNITY): Payer: Self-pay

## 2021-01-08 ENCOUNTER — Other Ambulatory Visit (HOSPITAL_COMMUNITY): Payer: Self-pay

## 2021-01-09 ENCOUNTER — Other Ambulatory Visit (HOSPITAL_COMMUNITY): Payer: Self-pay

## 2021-01-11 ENCOUNTER — Other Ambulatory Visit (HOSPITAL_COMMUNITY): Payer: Self-pay

## 2021-01-29 ENCOUNTER — Other Ambulatory Visit (HOSPITAL_COMMUNITY): Payer: Self-pay

## 2021-02-01 ENCOUNTER — Other Ambulatory Visit (HOSPITAL_COMMUNITY): Payer: Self-pay

## 2021-02-06 ENCOUNTER — Other Ambulatory Visit (HOSPITAL_COMMUNITY): Payer: Self-pay

## 2021-02-13 ENCOUNTER — Other Ambulatory Visit (HOSPITAL_COMMUNITY): Payer: Self-pay

## 2021-02-14 ENCOUNTER — Other Ambulatory Visit (HOSPITAL_COMMUNITY): Payer: Self-pay

## 2021-03-05 ENCOUNTER — Other Ambulatory Visit (HOSPITAL_COMMUNITY): Payer: Self-pay

## 2021-04-04 ENCOUNTER — Other Ambulatory Visit (HOSPITAL_COMMUNITY): Payer: Self-pay

## 2021-04-11 ENCOUNTER — Other Ambulatory Visit (HOSPITAL_COMMUNITY): Payer: Self-pay

## 2021-05-13 ENCOUNTER — Other Ambulatory Visit (HOSPITAL_COMMUNITY): Payer: Self-pay

## 2021-05-14 ENCOUNTER — Other Ambulatory Visit (HOSPITAL_COMMUNITY): Payer: Self-pay

## 2021-05-31 ENCOUNTER — Other Ambulatory Visit (HOSPITAL_COMMUNITY): Payer: Self-pay

## 2021-06-01 ENCOUNTER — Other Ambulatory Visit (HOSPITAL_COMMUNITY): Payer: Self-pay

## 2021-06-19 ENCOUNTER — Other Ambulatory Visit: Payer: Self-pay | Admitting: Internal Medicine

## 2021-06-20 ENCOUNTER — Other Ambulatory Visit (HOSPITAL_COMMUNITY): Payer: Self-pay

## 2021-06-20 MED ORDER — MELOXICAM 15 MG PO TABS
15.0000 mg | ORAL_TABLET | Freq: Every day | ORAL | 1 refills | Status: DC
  Filled 2021-06-20: qty 90, 90d supply, fill #0
  Filled 2021-10-23: qty 90, 90d supply, fill #1

## 2021-06-21 ENCOUNTER — Other Ambulatory Visit (HOSPITAL_COMMUNITY): Payer: Self-pay

## 2021-06-23 ENCOUNTER — Other Ambulatory Visit (HOSPITAL_COMMUNITY): Payer: Self-pay

## 2021-06-25 ENCOUNTER — Encounter: Payer: Self-pay | Admitting: Internal Medicine

## 2021-07-10 ENCOUNTER — Ambulatory Visit: Payer: 59 | Admitting: Internal Medicine

## 2021-07-21 ENCOUNTER — Other Ambulatory Visit (HOSPITAL_COMMUNITY): Payer: Self-pay

## 2021-07-25 ENCOUNTER — Other Ambulatory Visit (HOSPITAL_COMMUNITY): Payer: Self-pay

## 2021-08-20 ENCOUNTER — Other Ambulatory Visit (HOSPITAL_COMMUNITY): Payer: Self-pay

## 2021-08-24 ENCOUNTER — Other Ambulatory Visit (HOSPITAL_COMMUNITY): Payer: Self-pay

## 2021-09-16 ENCOUNTER — Other Ambulatory Visit (HOSPITAL_COMMUNITY): Payer: Self-pay

## 2021-09-17 ENCOUNTER — Other Ambulatory Visit (HOSPITAL_COMMUNITY): Payer: Self-pay

## 2021-09-22 ENCOUNTER — Other Ambulatory Visit (HOSPITAL_BASED_OUTPATIENT_CLINIC_OR_DEPARTMENT_OTHER): Payer: Self-pay

## 2021-09-22 ENCOUNTER — Other Ambulatory Visit (HOSPITAL_COMMUNITY): Payer: Self-pay

## 2021-10-23 ENCOUNTER — Other Ambulatory Visit: Payer: Self-pay | Admitting: Internal Medicine

## 2021-10-23 ENCOUNTER — Other Ambulatory Visit (HOSPITAL_COMMUNITY): Payer: Self-pay

## 2021-10-23 MED ORDER — TRIAMCINOLONE ACETONIDE 0.1 % EX CREA
TOPICAL_CREAM | Freq: Two times a day (BID) | CUTANEOUS | 0 refills | Status: AC | PRN
  Filled 2021-10-23: qty 30, 30d supply, fill #0

## 2021-10-24 ENCOUNTER — Other Ambulatory Visit (HOSPITAL_COMMUNITY): Payer: Self-pay

## 2021-10-24 MED ORDER — TADALAFIL 20 MG PO TABS
ORAL_TABLET | ORAL | 0 refills | Status: DC
  Filled 2021-10-24: qty 10, 30d supply, fill #0

## 2021-10-26 ENCOUNTER — Other Ambulatory Visit (HOSPITAL_COMMUNITY): Payer: Self-pay

## 2021-11-08 ENCOUNTER — Other Ambulatory Visit (HOSPITAL_COMMUNITY): Payer: Self-pay

## 2021-11-08 MED ORDER — CLINDAMYCIN HCL 300 MG PO CAPS
300.0000 mg | ORAL_CAPSULE | Freq: Three times a day (TID) | ORAL | 0 refills | Status: DC
  Filled 2021-11-08: qty 30, 10d supply, fill #0

## 2021-11-08 MED ORDER — IBUPROFEN 800 MG PO TABS
800.0000 mg | ORAL_TABLET | Freq: Four times a day (QID) | ORAL | 0 refills | Status: DC
  Filled 2021-11-08: qty 30, 8d supply, fill #0

## 2021-11-10 ENCOUNTER — Other Ambulatory Visit (HOSPITAL_COMMUNITY): Payer: Self-pay

## 2021-11-13 ENCOUNTER — Other Ambulatory Visit (HOSPITAL_COMMUNITY): Payer: Self-pay

## 2021-11-25 ENCOUNTER — Other Ambulatory Visit: Payer: Self-pay | Admitting: Internal Medicine

## 2021-11-26 ENCOUNTER — Other Ambulatory Visit (HOSPITAL_COMMUNITY): Payer: Self-pay

## 2021-11-26 MED ORDER — TADALAFIL 20 MG PO TABS
ORAL_TABLET | ORAL | 0 refills | Status: DC
  Filled 2021-11-26: qty 10, 30d supply, fill #0

## 2021-12-19 ENCOUNTER — Other Ambulatory Visit (HOSPITAL_COMMUNITY): Payer: Self-pay

## 2021-12-19 ENCOUNTER — Other Ambulatory Visit: Payer: Self-pay | Admitting: Internal Medicine

## 2021-12-19 MED ORDER — ROSUVASTATIN CALCIUM 10 MG PO TABS
10.0000 mg | ORAL_TABLET | Freq: Every day | ORAL | 0 refills | Status: DC
  Filled 2021-12-19: qty 30, 30d supply, fill #0

## 2021-12-19 MED ORDER — AMLODIPINE BESYLATE 10 MG PO TABS
10.0000 mg | ORAL_TABLET | Freq: Every day | ORAL | 0 refills | Status: DC
  Filled 2021-12-19: qty 30, 30d supply, fill #0

## 2021-12-19 MED ORDER — LOSARTAN POTASSIUM 50 MG PO TABS
50.0000 mg | ORAL_TABLET | Freq: Every day | ORAL | 0 refills | Status: DC
  Filled 2021-12-19: qty 30, 30d supply, fill #0

## 2021-12-19 MED ORDER — CARVEDILOL 12.5 MG PO TABS
12.5000 mg | ORAL_TABLET | Freq: Two times a day (BID) | ORAL | 0 refills | Status: DC
  Filled 2021-12-19: qty 60, 30d supply, fill #0

## 2021-12-19 MED ORDER — TADALAFIL 20 MG PO TABS
ORAL_TABLET | ORAL | 0 refills | Status: DC
  Filled 2021-12-19: qty 10, 30d supply, fill #0

## 2021-12-20 ENCOUNTER — Other Ambulatory Visit (HOSPITAL_COMMUNITY): Payer: Self-pay

## 2021-12-24 ENCOUNTER — Ambulatory Visit: Payer: 59 | Admitting: Internal Medicine

## 2021-12-24 ENCOUNTER — Other Ambulatory Visit (HOSPITAL_COMMUNITY): Payer: Self-pay

## 2021-12-24 ENCOUNTER — Encounter: Payer: 59 | Admitting: Internal Medicine

## 2021-12-25 ENCOUNTER — Other Ambulatory Visit (HOSPITAL_COMMUNITY): Payer: Self-pay

## 2022-01-02 ENCOUNTER — Encounter: Payer: 59 | Admitting: Internal Medicine

## 2022-01-24 ENCOUNTER — Other Ambulatory Visit: Payer: Self-pay | Admitting: Internal Medicine

## 2022-01-28 ENCOUNTER — Other Ambulatory Visit (HOSPITAL_COMMUNITY): Payer: Self-pay

## 2022-01-30 ENCOUNTER — Other Ambulatory Visit (HOSPITAL_COMMUNITY): Payer: Self-pay

## 2022-01-31 ENCOUNTER — Other Ambulatory Visit (HOSPITAL_COMMUNITY): Payer: Self-pay

## 2022-02-07 ENCOUNTER — Other Ambulatory Visit (HOSPITAL_COMMUNITY): Payer: Self-pay

## 2022-02-07 ENCOUNTER — Encounter: Payer: Self-pay | Admitting: Internal Medicine

## 2022-02-07 ENCOUNTER — Ambulatory Visit (INDEPENDENT_AMBULATORY_CARE_PROVIDER_SITE_OTHER): Payer: 59 | Admitting: Internal Medicine

## 2022-02-07 ENCOUNTER — Other Ambulatory Visit: Payer: Self-pay | Admitting: Internal Medicine

## 2022-02-07 VITALS — BP 148/80 | HR 79 | Temp 97.9°F | Ht 71.0 in | Wt 258.0 lb

## 2022-02-07 DIAGNOSIS — E538 Deficiency of other specified B group vitamins: Secondary | ICD-10-CM

## 2022-02-07 DIAGNOSIS — E559 Vitamin D deficiency, unspecified: Secondary | ICD-10-CM

## 2022-02-07 DIAGNOSIS — M1712 Unilateral primary osteoarthritis, left knee: Secondary | ICD-10-CM

## 2022-02-07 DIAGNOSIS — E118 Type 2 diabetes mellitus with unspecified complications: Secondary | ICD-10-CM | POA: Diagnosis not present

## 2022-02-07 DIAGNOSIS — N529 Male erectile dysfunction, unspecified: Secondary | ICD-10-CM

## 2022-02-07 DIAGNOSIS — I10 Essential (primary) hypertension: Secondary | ICD-10-CM

## 2022-02-07 DIAGNOSIS — Z0000 Encounter for general adult medical examination without abnormal findings: Secondary | ICD-10-CM

## 2022-02-07 DIAGNOSIS — R251 Tremor, unspecified: Secondary | ICD-10-CM

## 2022-02-07 MED ORDER — CLINDAMYCIN PHOSPHATE 1 % EX SOLN
Freq: Two times a day (BID) | CUTANEOUS | 3 refills | Status: AC
  Filled 2022-02-07: qty 120, 30d supply, fill #0

## 2022-02-07 MED ORDER — TRIAMCINOLONE ACETONIDE 0.1 % EX CREA
TOPICAL_CREAM | Freq: Two times a day (BID) | CUTANEOUS | 3 refills | Status: AC
  Filled 2022-02-07: qty 80, 30d supply, fill #0
  Filled 2022-12-09: qty 80, 30d supply, fill #1

## 2022-02-07 MED ORDER — TADALAFIL 20 MG PO TABS
ORAL_TABLET | ORAL | 3 refills | Status: DC
  Filled 2022-02-07 (×2): qty 15, 15d supply, fill #0
  Filled 2022-03-04: qty 30, 30d supply, fill #1
  Filled 2022-04-16: qty 11, 11d supply, fill #2
  Filled 2022-04-17: qty 19, 19d supply, fill #2
  Filled 2022-05-23: qty 30, 30d supply, fill #3
  Filled 2022-05-24: qty 5, 3d supply, fill #3
  Filled 2022-05-24: qty 25, 12d supply, fill #3
  Filled 2022-05-24: qty 27, 27d supply, fill #3
  Filled 2022-05-28 – 2022-05-31 (×4): qty 30, 30d supply, fill #3

## 2022-02-07 NOTE — Progress Notes (Signed)
Subjective:  Patient ID: Dale Bishop, male    DOB: 12-08-1960  Age: 61 y.o. MRN: [id number]  CC: Annual Exam   HPI Hatem Cull presents for a well exam C/o R hand index and thumb shaking lately, he dropped soda can twice. He has been working 12-hour days, using his mouse a lot with his index finger clicking all the time.  Med list reviewed   ROS: Review of Systems  Constitutional:  Negative for appetite change, fatigue and unexpected weight change.  HENT:  Negative for congestion, nosebleeds, sneezing, sore throat and trouble swallowing.   Eyes:  Negative for itching and visual disturbance.  Respiratory:  Negative for cough.   Cardiovascular:  Negative for chest pain, palpitations and leg swelling.  Gastrointestinal:  Negative for abdominal distention, blood in stool, diarrhea and nausea.  Genitourinary:  Negative for frequency and hematuria.  Musculoskeletal:  Negative for arthralgias, back pain, gait problem, joint swelling and neck pain.  Skin:  Negative for rash.  Neurological:  Negative for dizziness, tremors, speech difficulty and weakness.  Psychiatric/Behavioral:  Negative for agitation, dysphoric mood and sleep disturbance. The patient is not nervous/anxious.     Objective:  BP (!) 148/80 (BP Location: Left Arm)   Pulse 79   Temp 97.9 F (36.6 C) (Oral)   Ht '5\' 11"'$  (1.803 m)   Wt 258 lb (117 kg)   SpO2 95%   BMI 35.98 kg/m   BP Readings from Last 3 Encounters:  02/07/22 (!) 148/80  11/22/20 (!) 148/78  04/25/20 140/82    Wt Readings from Last 3 Encounters:  02/07/22 258 lb (117 kg)  11/22/20 257 lb 12.8 oz (116.9 kg)  04/25/20 254 lb 6.4 oz (115.4 kg)    Physical Exam Constitutional:      General: He is not in acute distress.    Appearance: He is well-developed. He is obese.     Comments: NAD  Eyes:     Conjunctiva/sclera: Conjunctivae normal.     Pupils: Pupils are equal, round, and reactive to light.  Neck:     Thyroid: No thyromegaly.      Vascular: No JVD.  Cardiovascular:     Rate and Rhythm: Normal rate and regular rhythm.     Heart sounds: Normal heart sounds. No murmur heard.    No friction rub. No gallop.  Pulmonary:     Effort: Pulmonary effort is normal. No respiratory distress.     Breath sounds: Normal breath sounds. No wheezing or rales.  Chest:     Chest wall: No tenderness.  Abdominal:     General: Bowel sounds are normal. There is no distension.     Palpations: Abdomen is soft. There is no mass.     Tenderness: There is no abdominal tenderness. There is no guarding or rebound.  Musculoskeletal:        General: No tenderness. Normal range of motion.     Cervical back: Normal range of motion.  Lymphadenopathy:     Cervical: No cervical adenopathy.  Skin:    General: Skin is warm and dry.     Findings: No rash.  Neurological:     Mental Status: He is alert and oriented to person, place, and time.     Cranial Nerves: No cranial nerve deficit.     Motor: No abnormal muscle tone.     Coordination: Coordination normal.     Gait: Gait normal.     Deep Tendon Reflexes: Reflexes are normal and symmetric.  Psychiatric:        Behavior: Behavior normal.        Thought Content: Thought content normal.        Judgment: Judgment normal.    Isidoro declined rectal exam Right index finger is nontender without visible deformities or discoloration Lab Results  Component Value Date   WBC 4.3 10/25/2020   HGB 14.0 10/25/2020   HCT 42.7 10/25/2020   PLT 240.0 10/25/2020   GLUCOSE 119 (H) 10/25/2020   CHOL 186 06/08/2019   TRIG 131.0 06/08/2019   HDL 44.10 06/08/2019   LDLCALC 115 (H) 06/08/2019   ALT 15 10/25/2020   AST 14 10/25/2020   NA 140 10/25/2020   K 3.7 10/25/2020   CL 106 10/25/2020   CREATININE 1.18 10/25/2020   BUN 12 10/25/2020   CO2 23 10/25/2020   TSH 2.20 10/25/2020   PSA 15.80 (H) 06/08/2019   HGBA1C 6.3 10/25/2020   MICROALBUR 3.6 (H) 06/08/2019    DG Knee Complete 4 Views  Left  Result Date: 01/08/2018 CLINICAL DATA:  Ten days of medial knee pain without known injury. EXAM: LEFT KNEE - COMPLETE 4+ VIEW COMPARISON:  None in PACs FINDINGS: The bones are subjectively adequately mineralized. There is no acute or healing fracture. The joint spaces are reasonably well-maintained. There is beaking of the tibial spines. Small spurs arise from the articular margins of the medial femoral condyle and medial tibial plateau. There spurs arising from the articular margins of the patella. There is no joint effusion. IMPRESSION: There is no acute bony abnormality of the left knee. 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 Electronically Signed   By: David  Martinique M.D.   On: 01/08/2018 11:07    Assessment & Plan:   Problem List Items Addressed This Visit     Erectile dysfunction    Cont on Cialis prn      Essential hypertension    On Amlodipine, Coreg, Losartan      Relevant Medications   tadalafil (CIALIS) 20 MG tablet   amLODipine (NORVASC) 10 MG tablet   carvedilol (COREG) 12.5 MG tablet   losartan (COZAAR) 50 MG tablet   Knee osteoarthritis    Pain in the knee has resolved after follow-up with his second job where he had to stand      Tremor, unspecified    New right index and thumb fatigue tremor due to overuse.  Zakery has been working 12-hour days, using his mouse a lot with his index finger clicking all the time. I suggest that he gets an ergonomic mouse.  Employee health appointment was suggested      Type II diabetes mellitus with manifestations ([place])    Check A1c      Relevant Medications   losartan (COZAAR) 50 MG tablet   Other Relevant Orders   Hemoglobin A1c   Vitamin D deficiency   Relevant Orders   VITAMIN D 25 Hydroxy (Vit-D Deficiency, Fractures)   Well adult exam - Primary    We discussed age appropriate health related issues, including available/recomended screening tests and vaccinations.  We discussed a need for adhering to healthy diet and exercise. Labs/EKG were reviewed/ordered. All questions were answered. Colon 4/17 due in 2027 cardiac CT scan for calcium scoring offered again      Relevant Orders   TSH   Urinalysis   CBC with Differential/Platelet   Lipid panel   PSA   Comprehensive metabolic panel   Other Visit Diagnoses  Low serum vitamin B12       Relevant Orders   Vitamin B12         Meds ordered this encounter  Medications   clindamycin (CLEOCIN T) 1 % external solution    Sig: Apply topically 2 times daily. Apply to rash    Dispense:  120 mL    Refill:  3   tadalafil (CIALIS) 20 MG tablet    Sig: Take as directed by mouth every 1 - 2 days    Dispense:  30 tablet    Refill:  3   triamcinolone cream (KENALOG) 0.1 %    Sig: Apply topically 2 times daily.    Dispense:  80 g    Refill:  3   amLODipine (NORVASC) 10 MG tablet    Sig: Take 1 tablet (10 mg total) by mouth daily.    Dispense:  90 tablet    Refill:  3   carvedilol (COREG) 12.5 MG tablet    Sig: Take 1 tablet (12.5 mg total) by mouth 2 (two) times daily with a meal.    Dispense:  180 tablet    Refill:  3   losartan (COZAAR) 50 MG tablet    Sig: Take 1 tablet (50 mg total) by mouth daily.    Dispense:  90 tablet    Refill:  3      Follow-up: Return in about 3 months (around 05/10/2022) for a follow-up visit.  Walker Kehr, MD

## 2022-02-07 NOTE — Assessment & Plan Note (Addendum)
We discussed age appropriate health related issues, including available/recomended screening tests and vaccinations. We discussed a need for adhering to healthy diet and exercise. Labs/EKG were reviewed/ordered. All questions were answered. Colon 4/17 due in 2027 cardiac CT scan for calcium scoring offered again

## 2022-02-07 NOTE — Assessment & Plan Note (Signed)
Cont on Cialis prn

## 2022-02-07 NOTE — Assessment & Plan Note (Signed)
On Amlodipine, Coreg, Losartan

## 2022-02-07 NOTE — Assessment & Plan Note (Signed)
Check A1c. 

## 2022-02-07 NOTE — Patient Instructions (Addendum)
Blue-Emu cream --use 2-3 times a day  Wegovy or Ozempic  Rybelsus

## 2022-02-08 ENCOUNTER — Other Ambulatory Visit (HOSPITAL_COMMUNITY): Payer: Self-pay

## 2022-02-08 MED ORDER — ROSUVASTATIN CALCIUM 10 MG PO TABS
10.0000 mg | ORAL_TABLET | Freq: Every day | ORAL | 3 refills | Status: DC
  Filled 2022-02-08: qty 90, 90d supply, fill #0
  Filled 2022-06-26: qty 90, 90d supply, fill #1
  Filled 2022-10-22: qty 90, 90d supply, fill #2

## 2022-02-10 DIAGNOSIS — R251 Tremor, unspecified: Secondary | ICD-10-CM | POA: Insufficient documentation

## 2022-02-10 MED ORDER — CARVEDILOL 12.5 MG PO TABS
12.5000 mg | ORAL_TABLET | Freq: Two times a day (BID) | ORAL | 3 refills | Status: DC
  Filled 2022-02-10: qty 180, 90d supply, fill #0

## 2022-02-10 MED ORDER — LOSARTAN POTASSIUM 50 MG PO TABS
50.0000 mg | ORAL_TABLET | Freq: Every day | ORAL | 3 refills | Status: DC
  Filled 2022-02-10: qty 90, 90d supply, fill #0
  Filled 2022-08-02: qty 90, 90d supply, fill #1
  Filled 2022-11-25: qty 90, 90d supply, fill #2

## 2022-02-10 MED ORDER — AMLODIPINE BESYLATE 10 MG PO TABS
10.0000 mg | ORAL_TABLET | Freq: Every day | ORAL | 3 refills | Status: DC
  Filled 2022-02-10: qty 90, 90d supply, fill #0
  Filled 2022-07-23: qty 90, 90d supply, fill #1
  Filled 2022-11-10: qty 90, 90d supply, fill #2

## 2022-02-10 NOTE — Assessment & Plan Note (Signed)
New right index and thumb fatigue tremor due to overuse.  Dale Bishop has been working 12-hour days, using his mouse a lot with his index finger clicking all the time. I suggest that he gets an ergonomic mouse.  Employee health appointment was suggested

## 2022-02-10 NOTE — Assessment & Plan Note (Signed)
Pain in the knee has resolved after follow-up with his second job where he had to stand

## 2022-02-11 ENCOUNTER — Other Ambulatory Visit (HOSPITAL_COMMUNITY): Payer: Self-pay

## 2022-02-12 ENCOUNTER — Other Ambulatory Visit (HOSPITAL_COMMUNITY): Payer: Self-pay

## 2022-02-13 ENCOUNTER — Other Ambulatory Visit (HOSPITAL_COMMUNITY): Payer: Self-pay

## 2022-02-18 ENCOUNTER — Other Ambulatory Visit: Payer: Self-pay | Admitting: Internal Medicine

## 2022-02-19 ENCOUNTER — Other Ambulatory Visit (HOSPITAL_COMMUNITY): Payer: Self-pay

## 2022-02-19 MED ORDER — MELOXICAM 15 MG PO TABS
15.0000 mg | ORAL_TABLET | Freq: Every day | ORAL | 1 refills | Status: DC
  Filled 2022-02-19 – 2022-03-04 (×2): qty 90, 90d supply, fill #0
  Filled 2022-07-25: qty 90, 90d supply, fill #1

## 2022-02-27 ENCOUNTER — Other Ambulatory Visit (HOSPITAL_COMMUNITY): Payer: Self-pay

## 2022-03-04 ENCOUNTER — Other Ambulatory Visit (HOSPITAL_COMMUNITY): Payer: Self-pay

## 2022-03-05 ENCOUNTER — Other Ambulatory Visit (HOSPITAL_COMMUNITY): Payer: Self-pay

## 2022-03-06 ENCOUNTER — Other Ambulatory Visit (HOSPITAL_COMMUNITY): Payer: Self-pay

## 2022-03-07 ENCOUNTER — Other Ambulatory Visit (HOSPITAL_COMMUNITY): Payer: Self-pay

## 2022-03-08 ENCOUNTER — Other Ambulatory Visit (HOSPITAL_COMMUNITY): Payer: Self-pay

## 2022-03-11 ENCOUNTER — Other Ambulatory Visit (HOSPITAL_COMMUNITY): Payer: Self-pay

## 2022-03-12 ENCOUNTER — Other Ambulatory Visit (HOSPITAL_COMMUNITY): Payer: Self-pay

## 2022-03-13 ENCOUNTER — Other Ambulatory Visit (HOSPITAL_COMMUNITY): Payer: Self-pay

## 2022-03-14 ENCOUNTER — Other Ambulatory Visit (HOSPITAL_COMMUNITY): Payer: Self-pay

## 2022-04-17 ENCOUNTER — Other Ambulatory Visit (HOSPITAL_COMMUNITY): Payer: Self-pay

## 2022-05-24 ENCOUNTER — Other Ambulatory Visit (HOSPITAL_COMMUNITY): Payer: Self-pay

## 2022-05-27 ENCOUNTER — Other Ambulatory Visit (HOSPITAL_COMMUNITY): Payer: Self-pay

## 2022-05-28 ENCOUNTER — Other Ambulatory Visit (HOSPITAL_COMMUNITY): Payer: Self-pay

## 2022-05-29 ENCOUNTER — Other Ambulatory Visit (HOSPITAL_COMMUNITY): Payer: Self-pay

## 2022-05-31 ENCOUNTER — Other Ambulatory Visit: Payer: Self-pay

## 2022-05-31 ENCOUNTER — Other Ambulatory Visit (HOSPITAL_COMMUNITY): Payer: Self-pay

## 2022-06-03 ENCOUNTER — Encounter (HOSPITAL_COMMUNITY): Payer: Self-pay

## 2022-06-03 ENCOUNTER — Other Ambulatory Visit (HOSPITAL_COMMUNITY): Payer: Self-pay

## 2022-06-27 ENCOUNTER — Other Ambulatory Visit: Payer: Self-pay

## 2022-06-27 ENCOUNTER — Other Ambulatory Visit (HOSPITAL_COMMUNITY): Payer: Self-pay

## 2022-07-03 ENCOUNTER — Other Ambulatory Visit: Payer: Self-pay | Admitting: Internal Medicine

## 2022-07-04 ENCOUNTER — Other Ambulatory Visit (HOSPITAL_COMMUNITY): Payer: Self-pay

## 2022-07-04 ENCOUNTER — Other Ambulatory Visit: Payer: Self-pay

## 2022-07-04 MED ORDER — TADALAFIL 20 MG PO TABS
ORAL_TABLET | ORAL | 3 refills | Status: DC
  Filled 2022-07-04: qty 30, 30d supply, fill #0
  Filled 2022-08-02 – 2022-08-07 (×2): qty 30, 30d supply, fill #1
  Filled 2022-09-08: qty 30, 30d supply, fill #2
  Filled 2022-10-10: qty 30, 30d supply, fill #3

## 2022-07-17 ENCOUNTER — Encounter (HOSPITAL_COMMUNITY): Payer: Self-pay

## 2022-07-17 ENCOUNTER — Ambulatory Visit (HOSPITAL_COMMUNITY)
Admission: EM | Admit: 2022-07-17 | Discharge: 2022-07-17 | Disposition: A | Discharge status: Home / Self Care | Payer: Commercial Managed Care - PPO

## 2022-07-17 DIAGNOSIS — M7918 Myalgia, other site: Secondary | ICD-10-CM | POA: Diagnosis not present
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
## 2022-07-24 ENCOUNTER — Other Ambulatory Visit (HOSPITAL_COMMUNITY): Payer: Self-pay

## 2022-07-25 ENCOUNTER — Other Ambulatory Visit (HOSPITAL_COMMUNITY): Payer: Self-pay

## 2022-08-03 ENCOUNTER — Other Ambulatory Visit (HOSPITAL_COMMUNITY): Payer: Self-pay

## 2022-08-07 ENCOUNTER — Other Ambulatory Visit (HOSPITAL_COMMUNITY): Payer: Self-pay

## 2022-08-29 NOTE — Progress Notes (Signed)
Orders placed.

## 2022-09-08 ENCOUNTER — Other Ambulatory Visit: Payer: Self-pay

## 2022-09-09 ENCOUNTER — Other Ambulatory Visit (HOSPITAL_COMMUNITY): Payer: Self-pay

## 2022-10-02 ENCOUNTER — Other Ambulatory Visit (HOSPITAL_COMMUNITY): Payer: Self-pay

## 2022-10-02 MED ORDER — OMRON 3 SERIES BP MONITOR DEVI
0 refills | Status: DC
  Filled 2022-10-02: qty 1, 30d supply, fill #0

## 2022-10-11 ENCOUNTER — Other Ambulatory Visit: Payer: Self-pay

## 2022-10-11 ENCOUNTER — Other Ambulatory Visit (HOSPITAL_COMMUNITY): Payer: Self-pay

## 2022-10-23 ENCOUNTER — Other Ambulatory Visit (HOSPITAL_COMMUNITY): Payer: Self-pay

## 2022-11-12 ENCOUNTER — Other Ambulatory Visit (HOSPITAL_COMMUNITY): Payer: Self-pay

## 2022-11-12 DIAGNOSIS — M25562 Pain in left knee: Secondary | ICD-10-CM | POA: Diagnosis not present

## 2022-11-25 ENCOUNTER — Other Ambulatory Visit: Payer: Self-pay | Admitting: Internal Medicine

## 2022-11-26 ENCOUNTER — Other Ambulatory Visit (HOSPITAL_COMMUNITY): Payer: Self-pay

## 2022-11-26 ENCOUNTER — Other Ambulatory Visit: Payer: Self-pay

## 2022-11-26 MED ORDER — MELOXICAM 15 MG PO TABS
15.0000 mg | ORAL_TABLET | Freq: Every day | ORAL | 0 refills | Status: DC
  Filled 2022-11-26: qty 90, 90d supply, fill #0

## 2022-11-26 MED ORDER — TADALAFIL 20 MG PO TABS
ORAL_TABLET | ORAL | 2 refills | Status: DC
  Filled 2022-11-26: qty 30, 30d supply, fill #0
  Filled 2023-01-03 – 2023-01-09 (×3): qty 30, 30d supply, fill #1

## 2022-12-09 ENCOUNTER — Other Ambulatory Visit (HOSPITAL_COMMUNITY): Payer: Self-pay

## 2022-12-09 ENCOUNTER — Other Ambulatory Visit: Payer: Self-pay

## 2023-01-04 ENCOUNTER — Other Ambulatory Visit (HOSPITAL_COMMUNITY): Payer: Self-pay

## 2023-01-07 ENCOUNTER — Other Ambulatory Visit: Payer: Self-pay

## 2023-01-09 ENCOUNTER — Other Ambulatory Visit (HOSPITAL_COMMUNITY): Payer: Self-pay

## 2023-01-10 ENCOUNTER — Other Ambulatory Visit: Payer: Self-pay

## 2023-01-15 ENCOUNTER — Encounter (INDEPENDENT_AMBULATORY_CARE_PROVIDER_SITE_OTHER): Payer: Self-pay

## 2023-02-10 ENCOUNTER — Other Ambulatory Visit: Payer: Self-pay | Admitting: Internal Medicine

## 2023-02-10 ENCOUNTER — Other Ambulatory Visit (HOSPITAL_COMMUNITY): Payer: Self-pay

## 2023-02-10 ENCOUNTER — Ambulatory Visit (INDEPENDENT_AMBULATORY_CARE_PROVIDER_SITE_OTHER): Payer: Commercial Managed Care - PPO | Admitting: Internal Medicine

## 2023-02-10 ENCOUNTER — Encounter: Payer: Self-pay | Admitting: Internal Medicine

## 2023-02-10 VITALS — BP 135/82 | HR 68 | Temp 97.5°F | Ht 71.0 in | Wt 261.0 lb

## 2023-02-10 DIAGNOSIS — E559 Vitamin D deficiency, unspecified: Secondary | ICD-10-CM

## 2023-02-10 DIAGNOSIS — M1712 Unilateral primary osteoarthritis, left knee: Secondary | ICD-10-CM | POA: Diagnosis not present

## 2023-02-10 DIAGNOSIS — I10 Essential (primary) hypertension: Secondary | ICD-10-CM | POA: Diagnosis not present

## 2023-02-10 DIAGNOSIS — Z0001 Encounter for general adult medical examination with abnormal findings: Secondary | ICD-10-CM | POA: Diagnosis not present

## 2023-02-10 DIAGNOSIS — Z125 Encounter for screening for malignant neoplasm of prostate: Secondary | ICD-10-CM

## 2023-02-10 DIAGNOSIS — Z23 Encounter for immunization: Secondary | ICD-10-CM | POA: Diagnosis not present

## 2023-02-10 DIAGNOSIS — Z0000 Encounter for general adult medical examination without abnormal findings: Secondary | ICD-10-CM

## 2023-02-10 DIAGNOSIS — E118 Type 2 diabetes mellitus with unspecified complications: Secondary | ICD-10-CM

## 2023-02-10 LAB — COMPREHENSIVE METABOLIC PANEL
ALT: 10 U/L (ref 0–53)
AST: 11 U/L (ref 0–37)
Albumin: 4.1 g/dL (ref 3.5–5.2)
Alkaline Phosphatase: 45 U/L (ref 39–117)
BUN: 15 mg/dL (ref 6–23)
CO2: 28 mEq/L (ref 19–32)
Calcium: 9.6 mg/dL (ref 8.4–10.5)
Chloride: 106 mEq/L (ref 96–112)
Creatinine, Ser: 1.1 mg/dL (ref 0.40–1.50)
GFR: 72.75 mL/min (ref >60.00)
Glucose, Bld: 97 mg/dL (ref 70–99)
Potassium: 4.2 mEq/L (ref 3.5–5.1)
Sodium: 141 mEq/L (ref 135–145)
Total Bilirubin: 0.6 mg/dL (ref 0.2–1.2)
Total Protein: 7 g/dL (ref 6.0–8.3)

## 2023-02-10 LAB — URINALYSIS
Bilirubin Urine: NEGATIVE
Hgb urine dipstick: NEGATIVE
Ketones, ur: NEGATIVE
Leukocytes,Ua: NEGATIVE
Nitrite: NEGATIVE
Specific Gravity, Urine: 1.025 (ref 1.000–1.030)
Total Protein, Urine: NEGATIVE
Urine Glucose: NEGATIVE
Urobilinogen, UA: 0.2 (ref 0.0–1.0)
pH: 6 (ref 5.0–8.0)

## 2023-02-10 LAB — CBC WITH DIFFERENTIAL/PLATELET
Basophils Absolute: 0 10*3/uL (ref 0.0–0.1)
Basophils Relative: 0.5 % (ref 0.0–3.0)
Eosinophils Absolute: 0.2 10*3/uL (ref 0.0–0.7)
Eosinophils Relative: 4.6 % (ref 0.0–5.0)
HCT: 43.5 % (ref 39.0–52.0)
Hemoglobin: 13.9 g/dL (ref 13.0–17.0)
Lymphocytes Relative: 27.2 % (ref 12.0–46.0)
Lymphs Abs: 1.4 10*3/uL (ref 0.7–4.0)
MCHC: 32 g/dL (ref 30.0–36.0)
MCV: 85.3 fl (ref 78.0–100.0)
Monocytes Absolute: 0.5 10*3/uL (ref 0.1–1.0)
Monocytes Relative: 8.8 % (ref 3.0–12.0)
Neutro Abs: 3.1 10*3/uL (ref 1.4–7.7)
Neutrophils Relative %: 58.9 % (ref 43.0–77.0)
Platelets: 262 10*3/uL (ref 150.0–400.0)
RBC: 5.1 Mil/uL (ref 4.22–5.81)
RDW: 14.2 % (ref 11.5–15.5)
WBC: 5.2 10*3/uL (ref 4.0–10.5)

## 2023-02-10 LAB — MICROALBUMIN / CREATININE URINE RATIO
Creatinine,U: 187.7 mg/dL
Microalb Creat Ratio: 0.5 mg/g (ref 0.0–30.0)
Microalb, Ur: 0.9 mg/dL (ref 0.0–1.9)

## 2023-02-10 LAB — LIPID PANEL
Cholesterol: 195 mg/dL (ref 0–200)
HDL: 54.3 mg/dL (ref >39.00)
LDL Cholesterol: 123 mg/dL — ABNORMAL HIGH (ref 0–99)
NonHDL: 140.89
Total CHOL/HDL Ratio: 4
Triglycerides: 89 mg/dL (ref 0.0–149.0)
VLDL: 17.8 mg/dL (ref 0.0–40.0)

## 2023-02-10 LAB — HEPATIC FUNCTION PANEL
ALT: 10 U/L (ref 0–53)
AST: 11 U/L (ref 0–37)
Albumin: 4.1 g/dL (ref 3.5–5.2)
Alkaline Phosphatase: 45 U/L (ref 39–117)
Bilirubin, Direct: 0.1 mg/dL (ref 0.0–0.3)
Total Bilirubin: 0.6 mg/dL (ref 0.2–1.2)
Total Protein: 7 g/dL (ref 6.0–8.3)

## 2023-02-10 LAB — PSA: PSA: 1.67 ng/mL (ref 0.10–4.00)

## 2023-02-10 LAB — HEMOGLOBIN A1C: Hgb A1c MFr Bld: 6.1 % (ref 4.6–6.5)

## 2023-02-10 LAB — TSH: TSH: 1.05 u[IU]/mL (ref 0.35–5.50)

## 2023-02-10 MED ORDER — METHYLPREDNISOLONE ACETATE 40 MG/ML IJ SUSP
40.0000 mg | Freq: Once | INTRAMUSCULAR | Status: AC
Start: 2023-02-10 — End: 2023-02-10
  Administered 2023-02-10: 40 mg via INTRAMUSCULAR

## 2023-02-10 MED ORDER — TADALAFIL 20 MG PO TABS
ORAL_TABLET | ORAL | 2 refills | Status: DC
  Filled 2023-02-10: qty 30, 30d supply, fill #0
  Filled 2023-03-26 – 2023-03-27 (×2): qty 30, 30d supply, fill #1
  Filled 2023-04-30 – 2023-05-01 (×2): qty 30, 30d supply, fill #2

## 2023-02-10 NOTE — Assessment & Plan Note (Signed)
Check A1c. 

## 2023-02-10 NOTE — Patient Instructions (Signed)
.  avp

## 2023-02-10 NOTE — Assessment & Plan Note (Signed)
Cont w/Vit D 

## 2023-02-10 NOTE — Assessment & Plan Note (Signed)
We discussed age appropriate health related issues, including available/recomended screening tests and vaccinations. We discussed a need for adhering to healthy diet and exercise. Labs/EKG were reviewed/ordered. All questions were answered. Colon 4/17 due in 2027 cardiac CT scan for calcium scoring offered 

## 2023-02-10 NOTE — Assessment & Plan Note (Signed)
Pain in the L knee - worse Pt asked for it to be injected See procedure
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
## 2023-02-10 NOTE — Assessment & Plan Note (Signed)
On Amlodipine, Coreg, Losartan 

## 2023-02-11 ENCOUNTER — Other Ambulatory Visit: Payer: Self-pay

## 2023-02-11 ENCOUNTER — Other Ambulatory Visit (HOSPITAL_COMMUNITY): Payer: Self-pay

## 2023-02-11 MED ORDER — ROSUVASTATIN CALCIUM 10 MG PO TABS
10.0000 mg | ORAL_TABLET | Freq: Every day | ORAL | 3 refills | Status: DC
  Filled 2023-02-11: qty 90, 90d supply, fill #0
  Filled 2023-05-25 – 2023-05-29 (×2): qty 90, 90d supply, fill #1
  Filled 2023-09-08 – 2023-09-09 (×2): qty 90, 90d supply, fill #2
  Filled 2024-01-12 – 2024-01-13 (×2): qty 90, 90d supply, fill #3

## 2023-02-12 ENCOUNTER — Other Ambulatory Visit (HOSPITAL_COMMUNITY): Payer: Self-pay

## 2023-02-24 ENCOUNTER — Other Ambulatory Visit: Payer: Self-pay | Admitting: Internal Medicine

## 2023-02-25 ENCOUNTER — Other Ambulatory Visit: Payer: Self-pay

## 2023-02-25 ENCOUNTER — Other Ambulatory Visit (HOSPITAL_COMMUNITY): Payer: Self-pay

## 2023-02-25 MED ORDER — AMLODIPINE BESYLATE 10 MG PO TABS
10.0000 mg | ORAL_TABLET | Freq: Every day | ORAL | 3 refills | Status: DC
  Filled 2023-02-25: qty 90, 90d supply, fill #0
  Filled 2023-06-10: qty 90, 90d supply, fill #1
  Filled 2023-09-08 – 2023-09-09 (×2): qty 90, 90d supply, fill #2
  Filled 2023-12-22: qty 90, 90d supply, fill #3

## 2023-02-25 MED ORDER — CARVEDILOL 12.5 MG PO TABS
12.5000 mg | ORAL_TABLET | Freq: Two times a day (BID) | ORAL | 3 refills | Status: DC
  Filled 2023-02-25: qty 180, 90d supply, fill #0

## 2023-03-26 ENCOUNTER — Other Ambulatory Visit (HOSPITAL_COMMUNITY): Payer: Self-pay

## 2023-03-26 ENCOUNTER — Other Ambulatory Visit: Payer: Self-pay | Admitting: Internal Medicine

## 2023-03-27 ENCOUNTER — Other Ambulatory Visit (HOSPITAL_COMMUNITY): Payer: Self-pay

## 2023-03-27 ENCOUNTER — Other Ambulatory Visit: Payer: Self-pay

## 2023-03-27 MED ORDER — LOSARTAN POTASSIUM 50 MG PO TABS
50.0000 mg | ORAL_TABLET | Freq: Every day | ORAL | 3 refills | Status: DC
  Filled 2023-03-27 – 2023-03-28 (×2): qty 90, 90d supply, fill #0
  Filled 2023-07-08: qty 90, 90d supply, fill #1
  Filled 2023-09-08 – 2023-10-09 (×2): qty 90, 90d supply, fill #2
  Filled 2023-10-11 – 2024-01-13 (×3): qty 90, 90d supply, fill #3

## 2023-03-28 ENCOUNTER — Telehealth: Payer: Self-pay | Admitting: Internal Medicine

## 2023-03-28 ENCOUNTER — Other Ambulatory Visit (HOSPITAL_COMMUNITY): Payer: Self-pay

## 2023-03-28 NOTE — Telephone Encounter (Signed)
Prescription Request  03/28/2023  LOV: 02/10/2023  What is the name of the medication or equipment? meloxicam (MOBIC) 15 MG tablet   Have you contacted your pharmacy to request a refill? No   Which pharmacy would you like this sent to?  [facility] [address] [place] [address] Phone: [phone] Fax: [phone]    Patient notified that their request is being sent to the clinical staff for review and that they should receive a response within 2 business days.   Please advise at Mobile [phone] (mobile)

## 2023-03-29 ENCOUNTER — Other Ambulatory Visit (HOSPITAL_COMMUNITY): Payer: Self-pay

## 2023-03-31 ENCOUNTER — Other Ambulatory Visit (HOSPITAL_COMMUNITY): Payer: Self-pay

## 2023-03-31 MED ORDER — MELOXICAM 15 MG PO TABS
15.0000 mg | ORAL_TABLET | Freq: Every day | ORAL | 0 refills | Status: DC
  Filled 2023-03-31: qty 90, 90d supply, fill #0

## 2023-03-31 NOTE — Telephone Encounter (Signed)
Done. Thanks.

## 2023-04-07 ENCOUNTER — Ambulatory Visit: Payer: Commercial Managed Care - PPO | Admitting: Internal Medicine

## 2023-04-08 ENCOUNTER — Ambulatory Visit: Payer: Commercial Managed Care - PPO | Admitting: Internal Medicine

## 2023-04-15 ENCOUNTER — Encounter: Payer: Self-pay | Admitting: Internal Medicine

## 2023-04-18 ENCOUNTER — Other Ambulatory Visit (HOSPITAL_COMMUNITY): Payer: Self-pay

## 2023-04-20 ENCOUNTER — Other Ambulatory Visit: Payer: Self-pay | Admitting: Internal Medicine

## 2023-04-20 MED ORDER — WEGOVY 0.25 MG/0.5ML ~~LOC~~ SOAJ
0.2500 mg | SUBCUTANEOUS | 2 refills | Status: DC
  Filled 2023-04-20: qty 2, 28d supply, fill #0

## 2023-04-21 ENCOUNTER — Other Ambulatory Visit: Payer: Self-pay

## 2023-04-21 ENCOUNTER — Other Ambulatory Visit (HOSPITAL_COMMUNITY): Payer: Self-pay

## 2023-04-22 ENCOUNTER — Other Ambulatory Visit (HOSPITAL_COMMUNITY): Payer: Self-pay

## 2023-04-30 ENCOUNTER — Other Ambulatory Visit (HOSPITAL_COMMUNITY): Payer: Self-pay

## 2023-05-01 ENCOUNTER — Other Ambulatory Visit: Payer: Self-pay

## 2023-05-01 ENCOUNTER — Other Ambulatory Visit (HOSPITAL_COMMUNITY): Payer: Self-pay

## 2023-05-26 ENCOUNTER — Other Ambulatory Visit (HOSPITAL_COMMUNITY): Payer: Self-pay

## 2023-05-29 ENCOUNTER — Other Ambulatory Visit: Payer: Self-pay

## 2023-05-29 ENCOUNTER — Other Ambulatory Visit (HOSPITAL_COMMUNITY): Payer: Self-pay

## 2023-06-05 ENCOUNTER — Other Ambulatory Visit (HOSPITAL_COMMUNITY): Payer: Self-pay

## 2023-06-06 ENCOUNTER — Other Ambulatory Visit: Payer: Self-pay | Admitting: Internal Medicine

## 2023-06-07 ENCOUNTER — Other Ambulatory Visit (HOSPITAL_COMMUNITY): Payer: Self-pay

## 2023-06-07 MED ORDER — TADALAFIL 20 MG PO TABS
ORAL_TABLET | ORAL | 2 refills | Status: DC
Start: 2023-06-07 — End: 2023-09-25
  Filled 2023-06-07: qty 5, 30d supply, fill #0
  Filled 2023-06-18: qty 5, 30d supply, fill #1
  Filled 2023-06-20: qty 30, 30d supply, fill #1
  Filled 2023-07-24: qty 30, 30d supply, fill #2
  Filled 2023-08-21: qty 25, 25d supply, fill #3

## 2023-06-09 ENCOUNTER — Other Ambulatory Visit (HOSPITAL_COMMUNITY): Payer: Self-pay

## 2023-06-10 ENCOUNTER — Other Ambulatory Visit (HOSPITAL_COMMUNITY): Payer: Self-pay

## 2023-06-11 ENCOUNTER — Other Ambulatory Visit (HOSPITAL_COMMUNITY): Payer: Self-pay

## 2023-06-12 ENCOUNTER — Other Ambulatory Visit (HOSPITAL_COMMUNITY): Payer: Self-pay

## 2023-06-19 ENCOUNTER — Other Ambulatory Visit (HOSPITAL_COMMUNITY): Payer: Self-pay

## 2023-06-20 ENCOUNTER — Other Ambulatory Visit: Payer: Self-pay

## 2023-07-02 ENCOUNTER — Other Ambulatory Visit (HOSPITAL_COMMUNITY): Payer: Self-pay

## 2023-07-02 ENCOUNTER — Ambulatory Visit: Payer: Commercial Managed Care - PPO | Admitting: Internal Medicine

## 2023-07-02 ENCOUNTER — Encounter: Payer: Self-pay | Admitting: Internal Medicine

## 2023-07-02 VITALS — BP 118/80 | HR 82 | Temp 98.6°F | Ht 71.0 in | Wt 258.0 lb

## 2023-07-02 DIAGNOSIS — E118 Type 2 diabetes mellitus with unspecified complications: Secondary | ICD-10-CM

## 2023-07-02 DIAGNOSIS — G8929 Other chronic pain: Secondary | ICD-10-CM

## 2023-07-02 DIAGNOSIS — I10 Essential (primary) hypertension: Secondary | ICD-10-CM | POA: Diagnosis not present

## 2023-07-02 DIAGNOSIS — E559 Vitamin D deficiency, unspecified: Secondary | ICD-10-CM | POA: Diagnosis not present

## 2023-07-02 DIAGNOSIS — M545 Low back pain, unspecified: Secondary | ICD-10-CM

## 2023-07-02 DIAGNOSIS — E538 Deficiency of other specified B group vitamins: Secondary | ICD-10-CM | POA: Diagnosis not present

## 2023-07-02 DIAGNOSIS — M1712 Unilateral primary osteoarthritis, left knee: Secondary | ICD-10-CM

## 2023-07-02 DIAGNOSIS — M5432 Sciatica, left side: Secondary | ICD-10-CM | POA: Diagnosis not present

## 2023-07-02 MED ORDER — TRAMADOL HCL 50 MG PO TABS
50.0000 mg | ORAL_TABLET | Freq: Four times a day (QID) | ORAL | 1 refills | Status: DC | PRN
Start: 2023-07-02 — End: 2023-09-13
  Filled 2023-07-02: qty 20, 5d supply, fill #0

## 2023-07-02 MED ORDER — METHYLPREDNISOLONE 4 MG PO TBPK
ORAL_TABLET | ORAL | 0 refills | Status: DC
  Filled 2023-07-02: qty 21, 6d supply, fill #0

## 2023-07-02 NOTE — Assessment & Plan Note (Signed)
 Worse L>R Tramadol  prn Medrol  pack Ortho ref - Dr Christiane Cowing Hold Rosuvastatin  x 2 weeks

## 2023-07-02 NOTE — Patient Instructions (Addendum)
 USEFUL THINGS FOR ARTHRITIS and musculoskeletal pains:    A "rice sock heating pad" refers to a homemade heating pad created by filling a sock with uncooked rice, which can be heated in a microwave to provide a warm compress for sore muscles, pain relief, or other applications; essentially, it's a simple way to generate heat using readily available materials.  Key points about rice sock heat: How to make it: Fill a clean sock (preferably a tube sock) about 2/3 full with uncooked rice, tie a knot at the top to secure the rice inside.  Heating it up: Place the rice sock in the microwave and heat in short intervals (usually around 30 seconds at a time) until it reaches the desired warmth.  Important considerations: Check temperature before applying: Always test the temperature of the rice sock before applying it to your skin to avoid burns.  Use a towel to protect skin: Wrap the rice sock in a thin towel to distribute the heat evenly and protect your skin.  Uses: Muscle aches and pains  Menstrual cramps  Neck pain  Arthritis discomfort   SILICONE PADS: Use them to open jars and bottles    BRIX JAR OPENER: Use them to open jars    NITRILE COATED GARDEN GLOVES: Use down to open jars, lift boxes, etc.   THUMB BRACE: Use it for thumb arthritis flareup pain     BLUE EMU CREAM: Use it 2-3 times a day on painful areas      Ortho ref - Dr Christiane Cowing Hold Rosuvastatin  x 2 weeks

## 2023-07-02 NOTE — Assessment & Plan Note (Signed)
Check A1c On diet 

## 2023-07-02 NOTE — Assessment & Plan Note (Signed)
On Amlodipine, Coreg, Losartan 

## 2023-07-02 NOTE — Assessment & Plan Note (Signed)
 Worse L>R, Last MRI in 2019 Tramadol  prn Medrol  pack Ortho ref - Dr Christiane Cowing Hold Rosuvastatin  x 2 weeks

## 2023-07-02 NOTE — Assessment & Plan Note (Signed)
Cont w/Vit D 

## 2023-07-02 NOTE — Progress Notes (Signed)
 Subjective:  Patient ID: Dale Bishop, male    DOB: 12/27/1960  Age: 63 y.o. MRN: [id number]  CC: Knee Pain (Discuss lt knee pain and knee replacement surgery. Pt is also having back pain.)   HPI [name] presents for LBP - worse, knee OA L>R - worse Can't sleep... 10/10 pain  Outpatient Medications Prior to Visit  Medication Sig Dispense Refill   amLODipine  (NORVASC ) 10 MG tablet Take 1 tablet (10 mg total) by mouth daily. 90 tablet 3   clindamycin  (CLEOCIN  T) 1 % external solution Apply topically 2 times daily. Apply to rash 120 mL 3   losartan  (COZAAR ) 50 MG tablet Take 1 tablet (50 mg total) by mouth daily. 90 tablet 3   meloxicam  (MOBIC ) 15 MG tablet Take 1 tablet (15 mg total) by mouth daily. Annual appt due in August must see provider for future refills 90 tablet 0   rosuvastatin  (CRESTOR ) 10 MG tablet Take 1 tablet (10 mg total) by mouth daily. 90 tablet 3   tadalafil  (CIALIS ) 20 MG tablet Take as directed. Annual appt due in August must see provider for future refills 30 tablet 2   carvedilol  (COREG ) 12.5 MG tablet Take 1 tablet (12.5 mg total) by mouth 2 (two) times daily with a meal. 180 tablet 3   Blood Pressure Monitoring (OMRON 3 SERIES BP MONITOR) DEVI Use as directed 1 each 0   Semaglutide -Weight Management (WEGOVY ) 0.25 MG/0.5ML SOAJ Inject 0.25 mg into the skin once a week. 2 mL 2   No facility-administered medications prior to visit.    ROS: Review of Systems  Constitutional:  Negative for appetite change, fatigue and unexpected weight change.  HENT:  Negative for congestion, nosebleeds, sneezing, sore throat and trouble swallowing.   Eyes:  Negative for itching and visual disturbance.  Respiratory:  Negative for cough.   Cardiovascular:  Negative for chest pain, palpitations and leg swelling.  Gastrointestinal:  Negative for abdominal distention, blood in stool, diarrhea and nausea.  Genitourinary:  Negative for frequency and hematuria.  Musculoskeletal:   Positive for arthralgias, back pain and gait problem. Negative for joint swelling and neck pain.  Skin:  Negative for rash.  Neurological:  Negative for dizziness, tremors, speech difficulty and weakness.  Psychiatric/Behavioral:  Negative for agitation, dysphoric mood, sleep disturbance and suicidal ideas. The patient is not nervous/anxious.     Objective:  BP 118/80 (BP Location: Left Arm, Patient Position: Sitting, Cuff Size: Normal)   Pulse 82   Temp 98.6 F (37 C) (Oral)   Ht 5\' 11"  (1.803 m)   Wt 258 lb (117 kg)   SpO2 96%   BMI 35.98 kg/m   BP Readings from Last 3 Encounters:  07/02/23 118/80  02/10/23 135/82  07/17/22 (!) 149/98    Wt Readings from Last 3 Encounters:  07/02/23 258 lb (117 kg)  02/10/23 261 lb (118.4 kg)  02/07/22 258 lb (117 kg)    Physical Exam Constitutional:      General: He is not in acute distress.    Appearance: He is well-developed. He is obese.     Comments: NAD  Eyes:     Conjunctiva/sclera: Conjunctivae normal.     Pupils: Pupils are equal, round, and reactive to light.  Neck:     Thyroid : No thyromegaly.     Vascular: No JVD.  Cardiovascular:     Rate and Rhythm: Normal rate and regular rhythm.     Heart sounds: Normal heart sounds. No murmur heard.  No friction rub. No gallop.  Pulmonary:     Effort: Pulmonary effort is normal. No respiratory distress.     Breath sounds: Normal breath sounds. No wheezing or rales.  Chest:     Chest wall: No tenderness.  Abdominal:     General: Bowel sounds are normal. There is no distension.     Palpations: Abdomen is soft. There is no mass.     Tenderness: There is no abdominal tenderness. There is no guarding or rebound.  Musculoskeletal:        General: No tenderness. Normal range of motion.     Cervical back: Normal range of motion.  Lymphadenopathy:     Cervical: No cervical adenopathy.  Skin:    General: Skin is warm and dry.     Findings: No rash.  Neurological:     Mental  Status: He is alert and oriented to person, place, and time.     Cranial Nerves: No cranial nerve deficit.     Motor: No weakness or abnormal muscle tone.     Coordination: Coordination normal.     Gait: Gait abnormal.     Deep Tendon Reflexes: Reflexes are normal and symmetric.  Psychiatric:        Behavior: Behavior normal.        Thought Content: Thought content normal.        Judgment: Judgment normal.   Antalgic gait, limping Stiff lumbar spine, painful range of motion Straight leg elevation is equivocal on the left  Left knee is tender with range of motion anteriorly, no swelling  Lab Results  Component Value Date   WBC 5.2 02/10/2023   HGB 13.9 02/10/2023   HCT 43.5 02/10/2023   PLT 262.0 02/10/2023   GLUCOSE 97 02/10/2023   CHOL 195 02/10/2023   TRIG 89.0 02/10/2023   HDL 54.30 02/10/2023   LDLCALC 123 (H) 02/10/2023   ALT 10 02/10/2023   ALT 10 02/10/2023   AST 11 02/10/2023   AST 11 02/10/2023   NA 141 02/10/2023   K 4.2 02/10/2023   CL 106 02/10/2023   CREATININE 1.10 02/10/2023   BUN 15 02/10/2023   CO2 28 02/10/2023   TSH 1.05 02/10/2023   PSA 1.67 02/10/2023   HGBA1C 6.1 02/10/2023   MICROALBUR 0.9 02/10/2023    No results found.  Assessment & Plan:   Problem List Items Addressed This Visit     Essential hypertension   On Amlodipine , Coreg , Losartan       Relevant Medications   traMADol  (ULTRAM ) 50 MG tablet   methylPREDNISolone  (MEDROL  DOSEPAK) 4 MG TBPK tablet   Other Relevant Orders   Ambulatory referral to Orthopedic Surgery   CK   Comprehensive metabolic panel   Hemoglobin A1c   Vitamin B12   VITAMIN D  25 Hydroxy (Vit-D Deficiency, Fractures)   Urinalysis   TSH   Knee osteoarthritis - Primary   Worse L>R Tramadol  prn Medrol  pack Ortho ref - Dr Christiane Cowing Hold Rosuvastatin  x 2 weeks      Relevant Medications   traMADol  (ULTRAM ) 50 MG tablet   methylPREDNISolone  (MEDROL  DOSEPAK) 4 MG TBPK tablet   Other Relevant Orders   Ambulatory  referral to Orthopedic Surgery   Type II diabetes mellitus with manifestations (HCC)   Check A1c On diet      Relevant Medications   traMADol  (ULTRAM ) 50 MG tablet   methylPREDNISolone  (MEDROL  DOSEPAK) 4 MG TBPK tablet   Other Relevant Orders   Ambulatory referral to Orthopedic Surgery  CK   Comprehensive metabolic panel   Hemoglobin A1c   Vitamin B12   VITAMIN D  25 Hydroxy (Vit-D Deficiency, Fractures)   Urinalysis   TSH   Vitamin D  deficiency   Cont w/Vit D      Relevant Orders   VITAMIN D  25 Hydroxy (Vit-D Deficiency, Fractures)   Sciatica   Worse L>R, Last MRI in 2019 Tramadol  prn Medrol  pack Ortho ref - Dr Christiane Cowing Hold Rosuvastatin  x 2 weeks      Low serum vitamin B12   Borderline low B12 Take B complex      Relevant Orders   Vitamin B12   Other Visit Diagnoses       Chronic bilateral low back pain, unspecified whether sciatica present       Relevant Medications   traMADol  (ULTRAM ) 50 MG tablet   methylPREDNISolone  (MEDROL  DOSEPAK) 4 MG TBPK tablet   Other Relevant Orders   Ambulatory referral to Orthopedic Surgery   CK         Meds ordered this encounter  Medications   traMADol  (ULTRAM ) 50 MG tablet    Sig: Take 1 tablet (50 mg total) by mouth every 6 (six) hours as needed.    Dispense:  20 tablet    Refill:  1   methylPREDNISolone  (MEDROL  DOSEPAK) 4 MG TBPK tablet    Sig: As directed    Dispense:  21 tablet    Refill:  0      Follow-up: Return in about 3 months (around 09/30/2023) for a follow-up visit.  Anitra Barn, MD

## 2023-07-02 NOTE — Assessment & Plan Note (Signed)
 Borderline low B12 Take B complex

## 2023-07-08 ENCOUNTER — Other Ambulatory Visit: Payer: Self-pay | Admitting: Internal Medicine

## 2023-07-08 ENCOUNTER — Other Ambulatory Visit (HOSPITAL_COMMUNITY): Payer: Self-pay

## 2023-07-09 ENCOUNTER — Other Ambulatory Visit (INDEPENDENT_AMBULATORY_CARE_PROVIDER_SITE_OTHER): Payer: Commercial Managed Care - PPO

## 2023-07-09 ENCOUNTER — Ambulatory Visit: Payer: Commercial Managed Care - PPO | Admitting: Orthopaedic Surgery

## 2023-07-09 DIAGNOSIS — M1712 Unilateral primary osteoarthritis, left knee: Secondary | ICD-10-CM | POA: Diagnosis not present

## 2023-07-09 DIAGNOSIS — M545 Low back pain, unspecified: Secondary | ICD-10-CM

## 2023-07-09 NOTE — Progress Notes (Signed)
Office Visit Note   Patient: Dale Bishop           Date of Birth: [birth year]           MRN: [id number] Visit Date: 07/09/2023              Requested by: Dale Garter, MD [address] [place],  [place] [address] PCP: Plotnikov, Georgina Quint, MD   Assessment & Plan: Visit Diagnoses:  1. Primary osteoarthritis of left knee   2. Low back pain, unspecified back pain laterality, unspecified chronicity, unspecified whether sciatica present     Plan: Dale Bishop is a very pleasant 63 year old gentleman with end-stage left knee varus DJD and low back pain and L4-5 spondylolisthesis.  His back symptoms are manageable and not reporting any focal deficits.  Will send him to physical therapy for strengthening.  In regards to the knee he has bone-on-bone and he has no quality of life and would like to move forward with a left total knee replacement.  Risk benefits prognosis reviewed.  Questions encouraged and answered.  Dale Bishop will call the patient to confirm surgery date.  Impression is severe left knee degenerative joint disease secondary to Osteoarthritis.  Bone on bone joint space narrowing is seen on radiographs with mild varus alignment.  At this point, conservative treatments fail to provide any significant relief and the pain is severely affecting ADLs and quality of life.  Based on treatment options, the patient has elected to move forward with a knee replacement.  We have discussed the surgical risks that include but are not limited to infection, DVT, leg length discrepancy, stiffness, numbness, tingling, incomplete relief of pain.  Recovery and prognosis were also reviewed.    Anticoagulants: No antithrombotic Postop anticoagulation: Xarelto Diabetic: No  Nickel allergy: No Prior DVT/PE: No Tobacco use: No Clearances needed for surgery: Plotnikov Anticipated discharge dispo: Home   Follow-Up Instructions: No follow-ups on file.   Orders:  Orders Placed This Encounter  Procedures    XR KNEE 3 VIEW LEFT   XR Lumbar Spine 2-3 Views   Ambulatory referral to Physical Therapy   No orders of the defined types were placed in this encounter.     Procedures: No procedures performed   Clinical Data: No additional findings.   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  He comes in for chronic left knee and low back pain.  He has had these issues since 2019.  He has had an MRI of the lumbar spine in the past.  He has had cortisone injections in his left knee which previously were effective but are no longer effective.  He has had significant interruption in quality of life and daily activities. Review of Systems  Constitutional: Negative.   HENT: Negative.    Eyes: Negative.   Respiratory: Negative.    Cardiovascular: Negative.   Gastrointestinal: Negative.   Endocrine: Negative.   Genitourinary: Negative.   Musculoskeletal:  Positive for arthralgias and back pain.  Skin: Negative.   Allergic/Immunologic: Negative.   Neurological: Negative.   Hematological: Negative.   Psychiatric/Behavioral: Negative.    All other systems reviewed and are negative.    Objective: Vital Signs: There were no vitals taken for this visit.  Physical Exam Vitals and nursing note reviewed.  Constitutional:  Appearance: He is well-developed.  HENT:     Head: Normocephalic and atraumatic.  Eyes:     Pupils: Pupils are equal, round, and reactive to light.  Pulmonary:     Effort: Pulmonary effort is normal.  Abdominal:     Palpations: Abdomen is soft.  Musculoskeletal:        General: Normal range of motion.     Cervical back: Neck supple.  Skin:    General: Skin is warm.  Neurological:     Mental Status: He is alert and oriented to person, place, and time.  Psychiatric:        Behavior: Behavior normal.         Thought Content: Thought content normal.        Judgment: Judgment normal.     Ortho Exam Examination of the lumbar spine and left lower extremity is nonfocal.  Negative sciatic tension signs.  Examination of left knee shows trace effusion.  Mild varus deformity.  Diffuse global pain throughout the knee with range of motion.  Crepitus is present.  Collaterals and cruciates are stable. Specialty Comments:  No specialty comments available.  Imaging: XR KNEE 3 VIEW LEFT Result Date: 07/09/2023 X-rays of the left knee show advanced tricompartmental degenerative joint disease.  Bone-on-bone joint space narrowing of the medial and patellofemoral compartments with mild varus deformity.  XR Lumbar Spine 2-3 Views Result Date: 07/09/2023 X-rays of the lumbar spine showed no acute abnormalities.  There is progression of L4-5 spondylolisthesis grade 2.    PMFS History: Patient Active Problem List   Diagnosis Date Noted   Low serum vitamin B12 07/02/2023   Tremor, unspecified 02/10/2022   Cervical radiculopathy 01/24/2020   Sebaceous cyst 01/24/2020   Sciatica 01/24/2020   Erectile dysfunction 08/18/2019   Gross hematuria 06/08/2019   Need for pneumococcal vaccination 06/08/2019   Need for Tdap vaccination 06/08/2019   Acute prostatitis 06/08/2019   Knee osteoarthritis 08/05/2016   Type II diabetes mellitus with manifestations (HCC) 08/05/2016   Vitamin D deficiency 08/05/2016   CTS (carpal tunnel syndrome) 01/08/2015   Well adult exam 08/01/2014   Allergic rhinitis 04/21/2014   Hyperlipidemia with target LDL less than 100 10/22/2013   Essential hypertension 09/01/2013   Eczema 09/01/2013   Past Medical History:  Diagnosis Date   Hyperlipidemia    Hypertension     Family History  Problem Relation Age of Onset   Colon cancer Neg Hx     Past Surgical History:  Procedure Laterality Date   NO PAST SURGERIES     Social History   Occupational History   Not on file   Tobacco Use   Smoking status: Never   Smokeless tobacco: Never  Substance and Sexual Activity   Alcohol use: No    Alcohol/week: 0.0 standard drinks of alcohol   Drug use: No   Sexual activity: Yes

## 2023-07-13 ENCOUNTER — Other Ambulatory Visit (HOSPITAL_COMMUNITY): Payer: Self-pay

## 2023-07-13 MED ORDER — MELOXICAM 15 MG PO TABS
15.0000 mg | ORAL_TABLET | Freq: Every day | ORAL | 0 refills | Status: DC
  Filled 2023-07-13 – 2023-07-17 (×2): qty 90, 90d supply, fill #0

## 2023-07-14 ENCOUNTER — Encounter: Payer: Self-pay | Admitting: Pharmacist

## 2023-07-14 ENCOUNTER — Other Ambulatory Visit: Payer: Self-pay

## 2023-07-14 ENCOUNTER — Other Ambulatory Visit (HOSPITAL_COMMUNITY): Payer: Self-pay

## 2023-07-17 ENCOUNTER — Encounter: Payer: Self-pay | Admitting: Orthopaedic Surgery

## 2023-07-17 ENCOUNTER — Other Ambulatory Visit: Payer: Self-pay

## 2023-07-17 ENCOUNTER — Other Ambulatory Visit (HOSPITAL_COMMUNITY): Payer: Self-pay

## 2023-07-17 NOTE — Telephone Encounter (Signed)
See message from patient. Thank you.

## 2023-07-18 ENCOUNTER — Encounter: Payer: Self-pay | Admitting: Internal Medicine

## 2023-07-21 NOTE — Telephone Encounter (Signed)
Debbie, see his latest message about the fax?

## 2023-07-22 ENCOUNTER — Other Ambulatory Visit (HOSPITAL_COMMUNITY): Payer: Self-pay

## 2023-07-24 ENCOUNTER — Other Ambulatory Visit (HOSPITAL_COMMUNITY): Payer: Self-pay

## 2023-07-24 ENCOUNTER — Other Ambulatory Visit: Payer: Self-pay

## 2023-08-04 ENCOUNTER — Other Ambulatory Visit: Payer: Self-pay

## 2023-08-21 ENCOUNTER — Other Ambulatory Visit (HOSPITAL_COMMUNITY): Payer: Self-pay

## 2023-08-21 ENCOUNTER — Other Ambulatory Visit: Payer: Self-pay

## 2023-09-02 ENCOUNTER — Other Ambulatory Visit: Payer: Self-pay

## 2023-09-02 ENCOUNTER — Encounter: Payer: Self-pay | Admitting: Pharmacist

## 2023-09-02 ENCOUNTER — Other Ambulatory Visit (HOSPITAL_COMMUNITY): Payer: Self-pay

## 2023-09-02 ENCOUNTER — Other Ambulatory Visit: Payer: Self-pay | Admitting: Physician Assistant

## 2023-09-02 MED ORDER — OXYCODONE-ACETAMINOPHEN 5-325 MG PO TABS
1.0000 | ORAL_TABLET | Freq: Four times a day (QID) | ORAL | 0 refills | Status: DC | PRN
  Filled 2023-09-02 – 2023-09-03 (×2): qty 40, 5d supply, fill #0

## 2023-09-02 MED ORDER — DOCUSATE SODIUM 100 MG PO CAPS
100.0000 mg | ORAL_CAPSULE | Freq: Every day | ORAL | 2 refills | Status: DC | PRN
  Filled 2023-09-02 – 2023-09-03 (×2): qty 30, 30d supply, fill #0
  Filled 2023-10-09: qty 30, 30d supply, fill #1

## 2023-09-02 MED ORDER — DOXYCYCLINE HYCLATE 100 MG PO TABS
100.0000 mg | ORAL_TABLET | Freq: Two times a day (BID) | ORAL | 0 refills | Status: DC
  Filled 2023-09-02 – 2023-09-03 (×2): qty 20, 10d supply, fill #0

## 2023-09-02 MED ORDER — ONDANSETRON HCL 4 MG PO TABS
4.0000 mg | ORAL_TABLET | Freq: Three times a day (TID) | ORAL | 0 refills | Status: DC | PRN
  Filled 2023-09-02 – 2023-09-03 (×2): qty 40, 14d supply, fill #0

## 2023-09-02 MED ORDER — METHOCARBAMOL 750 MG PO TABS
750.0000 mg | ORAL_TABLET | Freq: Three times a day (TID) | ORAL | 2 refills | Status: DC | PRN
Start: 2023-09-02 — End: 2023-09-25
  Filled 2023-09-02 – 2023-09-03 (×2): qty 30, 10d supply, fill #0

## 2023-09-03 ENCOUNTER — Encounter: Payer: Self-pay | Admitting: Pharmacist

## 2023-09-03 ENCOUNTER — Other Ambulatory Visit (HOSPITAL_COMMUNITY): Payer: Self-pay

## 2023-09-03 ENCOUNTER — Other Ambulatory Visit: Payer: Self-pay

## 2023-09-05 NOTE — Progress Notes (Signed)
 Surgical Instructions   Your procedure is scheduled on Friday, March 28th, 2025. Report to [facility] Main Entrance "A" at 5:30 A.M., then check in with the Admitting office. Any questions or running late day of surgery: call [phone]  Questions prior to your surgery date: call [phone], Monday-Friday, 8am-4pm. If you experience any cold or flu symptoms such as cough, fever, chills, shortness of breath, etc. between now and your scheduled surgery, please notify us at the above number.     Remember:  Do not eat after midnight the night before your surgery  You may drink clear liquids until 4:15 the morning of your surgery.   Clear liquids allowed are: Water, Non-Citrus Juices (without pulp), Carbonated Beverages, Clear Tea (no milk, honey, etc.), Black Coffee Only (NO MILK, CREAM OR POWDERED CREAMER of any kind), and Gatorade.  Patient Instructions  The night before surgery:  No food after midnight. ONLY clear liquids after midnight  The day of surgery (if you do NOT have diabetes):  Drink ONE (1) Pre-Surgery Clear Ensure by 4:15 the morning of surgery. Drink in one sitting. Do not sip.  This drink was given to you during your hospital  pre-op appointment visit.  Nothing else to drink after completing the  Pre-Surgery Clear Ensure.          If you have questions, please contact your surgeon's office.     Take these medicines the morning of surgery with A SIP OF WATER: Amlodipine (Norvasc) Carvedilol (Coreg) Loratadine (Claritin) Rosuvastatin (Crestor)   May take these medicines IF NEEDED: None.     One week prior to surgery, STOP taking any Aspirin (unless otherwise instructed by your surgeon) Aleve, Naproxen, Ibuprofen, Motrin, Advil, Goody's, BC's, all herbal medications, fish oil, and non-prescription vitamins.  This includes your Meloxicam (Mobic).                     Do NOT Smoke (Tobacco/Vaping) for 24 hours prior to your procedure.  If you use a CPAP at  night, you may bring your mask/headgear for your overnight stay.   You will be asked to remove any contacts, glasses, piercing's, hearing aid's, dentures/partials prior to surgery. Please bring cases for these items if needed.    Patients discharged the day of surgery will not be allowed to drive home, and someone needs to stay with them for 24 hours.  SURGICAL WAITING ROOM VISITATION Patients may have no more than 2 support people in the waiting area - these visitors may rotate.   Pre-op nurse will coordinate an appropriate time for 1 ADULT support person, who may not rotate, to accompany patient in pre-op.  Children under the age of 71 must have an adult with them who is not the patient and must remain in the main waiting area with an adult.  If the patient needs to stay at the hospital during part of their recovery, the visitor guidelines for inpatient rooms apply.  Please refer to the [facility] website for the visitor guidelines for any additional information.   If you received a COVID test during your pre-op visit  it is requested that you wear a mask when out in public, stay away from anyone that may not be feeling well and notify your surgeon if you develop symptoms. If you have been in contact with anyone that has tested positive in the last 10 days please notify you surgeon.      Pre-operative 5 CHG Bathing Instructions   You  can play a key role in reducing the risk of infection after surgery. Your skin needs to be as free of germs as possible. You can reduce the number of germs on your skin by washing with CHG (chlorhexidine gluconate) soap before surgery. CHG is an antiseptic soap that kills germs and continues to kill germs even after washing.   DO NOT use if you have an allergy to chlorhexidine/CHG or antibacterial soaps. If your skin becomes reddened or irritated, stop using the CHG and notify one of our RNs at [phone].   Please shower with the CHG soap starting 4 days  before surgery using the following schedule:     Please keep in mind the following:  DO NOT shave, including legs and underarms, starting the day of your first shower.   You may shave your face at any point before/day of surgery.  Place clean sheets on your bed the day you start using CHG soap. Use a clean washcloth (not used since being washed) for each shower. DO NOT sleep with pets once you start using the CHG.   CHG Shower Instructions:  Wash your face and private area with normal soap. If you choose to wash your hair, wash first with your normal shampoo.  After you use shampoo/soap, rinse your hair and body thoroughly to remove shampoo/soap residue.  Turn the water OFF and apply about 3 tablespoons (45 ml) of CHG soap to a CLEAN washcloth.  Apply CHG soap ONLY FROM YOUR NECK DOWN TO YOUR TOES (washing for 3-5 minutes)  DO NOT use CHG soap on face, private areas, open wounds, or sores.  Pay special attention to the area where your surgery is being performed.  If you are having back surgery, having someone wash your back for you may be helpful. Wait 2 minutes after CHG soap is applied, then you may rinse off the CHG soap.  Pat dry with a clean towel  Put on clean clothes/pajamas   If you choose to wear lotion, please use ONLY the CHG-compatible lotions that are listed below.  Additional instructions for the day of surgery: DO NOT APPLY any lotions, deodorants, cologne, or perfumes.   Do not bring valuables to the hospital. [facility] is not responsible for any belongings/valuables. Do not wear nail polish, gel polish, artificial nails, or any other type of covering on natural nails (fingers and toes) Do not wear jewelry or makeup Put on clean/comfortable clothes.  Please brush your teeth.  Ask your nurse before applying any prescription medications to the skin.     CHG Compatible Lotions   Aveeno Moisturizing lotion  Cetaphil Moisturizing Cream  Cetaphil Moisturizing  Lotion  Clairol Herbal Essence Moisturizing Lotion, Dry Skin  Clairol Herbal Essence Moisturizing Lotion, Extra Dry Skin  Clairol Herbal Essence Moisturizing Lotion, Normal Skin  Curel Age Defying Therapeutic Moisturizing Lotion with Alpha Hydroxy  Curel Extreme Care Body Lotion  Curel Soothing Hands Moisturizing Hand Lotion  Curel Therapeutic Moisturizing Cream, Fragrance-Free  Curel Therapeutic Moisturizing Lotion, Fragrance-Free  Curel Therapeutic Moisturizing Lotion, Original Formula  Eucerin Daily Replenishing Lotion  Eucerin Dry Skin Therapy Plus Alpha Hydroxy Crme  Eucerin Dry Skin Therapy Plus Alpha Hydroxy Lotion  Eucerin Original Crme  Eucerin Original Lotion  Eucerin Plus Crme Eucerin Plus Lotion  Eucerin TriLipid Replenishing Lotion  Keri Anti-Bacterial Hand Lotion  Keri Deep Conditioning Original Lotion Dry Skin Formula Softly Scented  Keri Deep Conditioning Original Lotion, Fragrance Free Sensitive Skin Formula  Keri Lotion Fast Absorbing Fragrance  Free Sensitive Skin Formula  Keri Lotion Fast Absorbing Softly Scented Dry Skin Formula  Keri Original Lotion  Keri Skin Renewal Lotion Keri Silky Smooth Lotion  Keri Silky Smooth Sensitive Skin Lotion  Nivea Body Creamy Conditioning Oil  Nivea Body Extra Enriched Lotion  Nivea Body Original Lotion  Nivea Body Sheer Moisturizing Lotion Nivea Crme  Nivea Skin Firming Lotion  NutraDerm 30 Skin Lotion  NutraDerm Skin Lotion  NutraDerm Therapeutic Skin Cream  NutraDerm Therapeutic Skin Lotion  ProShield Protective Hand Cream  Provon moisturizing lotion  Please read over the following fact sheets that you were given.

## 2023-09-08 ENCOUNTER — Encounter (HOSPITAL_COMMUNITY)
Admission: RE | Admit: 2023-09-08 | Discharge: 2023-09-08 | Disposition: A | Discharge status: Home / Self Care | Source: Ambulatory Visit | Attending: Orthopaedic Surgery | Admitting: Orthopaedic Surgery

## 2023-09-08 ENCOUNTER — Other Ambulatory Visit: Payer: Self-pay

## 2023-09-08 ENCOUNTER — Encounter (HOSPITAL_COMMUNITY): Payer: Self-pay

## 2023-09-08 ENCOUNTER — Other Ambulatory Visit (HOSPITAL_COMMUNITY): Payer: Self-pay

## 2023-09-08 VITALS — BP 147/98 | HR 78 | Temp 98.3°F | Resp 18 | Ht 71.0 in | Wt 236.8 lb

## 2023-09-08 DIAGNOSIS — Z01818 Encounter for other preprocedural examination: Secondary | ICD-10-CM | POA: Diagnosis not present

## 2023-09-08 DIAGNOSIS — M1712 Unilateral primary osteoarthritis, left knee: Secondary | ICD-10-CM | POA: Diagnosis not present

## 2023-09-08 DIAGNOSIS — E118 Type 2 diabetes mellitus with unspecified complications: Secondary | ICD-10-CM | POA: Insufficient documentation

## 2023-09-08 HISTORY — DX: Type 2 diabetes mellitus without complications: E11.9

## 2023-09-08 LAB — HEMOGLOBIN A1C
Hgb A1c MFr Bld: 5.9 % — ABNORMAL HIGH (ref 4.8–5.6)
Mean Plasma Glucose: 122.63 mg/dL

## 2023-09-08 LAB — GLUCOSE, CAPILLARY: Glucose-Capillary: 128 mg/dL — ABNORMAL HIGH (ref 70–99)

## 2023-09-08 LAB — BASIC METABOLIC PANEL
Anion gap: 7 (ref 5–15)
BUN: 13 mg/dL (ref 8–23)
CO2: 28 mmol/L (ref 22–32)
Calcium: 9.9 mg/dL (ref 8.9–10.3)
Chloride: 106 mmol/L (ref 98–111)
Creatinine, Ser: 1.25 mg/dL — ABNORMAL HIGH (ref 0.61–1.24)
GFR, Estimated: >60 mL/min (ref >60)
Glucose, Bld: 116 mg/dL — ABNORMAL HIGH (ref 70–99)
Potassium: 4.4 mmol/L (ref 3.5–5.1)
Sodium: 141 mmol/L (ref 135–145)

## 2023-09-08 LAB — SURGICAL PCR SCREEN
MRSA, PCR: NEGATIVE
Staphylococcus aureus: POSITIVE — AB

## 2023-09-08 LAB — CBC
HCT: 44.5 % (ref 39.0–52.0)
Hemoglobin: 14.5 g/dL (ref 13.0–17.0)
MCH: 28 pg (ref 26.0–34.0)
MCHC: 32.6 g/dL (ref 30.0–36.0)
MCV: 86.1 fL (ref 80.0–100.0)
Platelets: 263 10*3/uL (ref 150–400)
RBC: 5.17 MIL/uL (ref 4.22–5.81)
RDW: 13.3 % (ref 11.5–15.5)
WBC: 5.1 10*3/uL (ref 4.0–10.5)
nRBC: 0 % (ref 0.0–0.2)

## 2023-09-08 NOTE — Progress Notes (Signed)
 PCP - Dr. Georgina Quint. Plotnikob Cardiologist - Denies - PCP manages HTN  PPM/ICD - Denies Device Orders - n/a Rep Notified - n/a  Chest x-ray - Denies EKG - 09-08-23 Stress Test - Denies ECHO - Denies Cardiac Cath - Denies  Sleep Study - Denies CPAP - n/a  Fasting Blood Sugar - patient does not check blood sugars; diet controlled.   Last dose of GLP1 agonist-  Denies GLP1 instructions: n/a  Blood Thinner Instructions: Denies Aspirin Instructions: Denies  ERAS Protcol - Clears until 4:15 PRE-SURGERY Ensure or G2-  Ensure  COVID TEST- None   Anesthesia review: Y, new EKG w/HTN, DM  Patient denies shortness of breath, fever, cough and chest pain at PAT appointment. Patient denies any respiratory issues at this tim.e    All instructions explained to the patient, with a verbal understanding of the material. Patient agrees to go over the instructions while at home for a better understanding. Patient also instructed to self quarantine after being tested for COVID-19. The opportunity to ask questions was provided.

## 2023-09-09 ENCOUNTER — Other Ambulatory Visit (HOSPITAL_COMMUNITY): Payer: Self-pay

## 2023-09-09 NOTE — Anesthesia Preprocedure Evaluation (Signed)
 Anesthesia Evaluation  Patient identified by MRN, date of birth, ID band Patient awake    Reviewed: Allergy & Precautions, NPO status , Patient's Chart, lab work & pertinent test results  Airway Mallampati: III  TM Distance: >3 FB Neck ROM: Full    Dental  (+) Edentulous Lower, Edentulous Upper   Pulmonary neg pulmonary ROS   Pulmonary exam normal        Cardiovascular hypertension, Pt. on medications and Pt. on home beta blockers Normal cardiovascular exam     Neuro/Psych  Neuromuscular disease  negative psych ROS   GI/Hepatic negative GI ROS, Neg liver ROS,,,  Endo/Other  Diabetes: PRE-DM.    Renal/GU Renal disease     Musculoskeletal  (+) Arthritis ,    Abdominal  (+) + obese  Peds  Hematology negative hematology ROS (+) Plt: 263   Anesthesia Other Findings left knee osteoarthritis  Reproductive/Obstetrics                             Anesthesia Physical Anesthesia Plan  ASA: 2  Anesthesia Plan: Spinal and Regional   Post-op Pain Management: Regional block*   Induction: Intravenous  PONV Risk Score and Plan: 1 and Ondansetron, Dexamethasone, Propofol infusion, Midazolam and Treatment may vary due to age or medical condition  Airway Management Planned: Simple Face Mask  Additional Equipment:   Intra-op Plan:   Post-operative Plan:   Informed Consent: I have reviewed the patients History and Physical, chart, labs and discussed the procedure including the risks, benefits and alternatives for the proposed anesthesia with the patient or authorized representative who has indicated his/her understanding and acceptance.     Dental advisory given  Plan Discussed with: CRNA  Anesthesia Plan Comments: (PAT note by Antionette Poles, PA-C: 63 year old male with pertinent history including HLD, HTN, diet-controlled DM2.  History of chronically abnormal EKG with lateral T wave  inversions.  This has been present since at least 2015.  No known history of cardiovascular disease.  Chronic conditions are followed by PCP Dr. Posey Rea.  He was last seen on 07/02/2023 with worsening knee pain and was referred for orthopedic surgery evaluation.  Dr. Posey Rea did subsequently clear the patient as low risk in clearance dated 07/28/2023.  Preop labs reviewed, mildly elevated creatinine 1.25, otherwise WNL.  DM2 well-controlled with A1c 5.9.  EKG 09/08/23: Normal sinus rhythm. Rate 61. Possible Left atrial enlargement. T wave abnormality, consider lateral ischemia  EKG 09/01/2013: Sinus  Rhythm. Rate 81.Left atrial enlargement. T-abnormality. Anterolateral ischemia.    )        Anesthesia Quick Evaluation

## 2023-09-09 NOTE — Progress Notes (Signed)
 Anesthesia Chart Review:  63 year old male with pertinent history including HLD, HTN, diet-controlled DM2.  History of chronically abnormal EKG with lateral T wave inversions.  This has been present since at least 2015.  No known history of cardiovascular disease.  Chronic conditions are followed by PCP Dr. Posey Rea.  He was last seen on 07/02/2023 with worsening knee pain and was referred for orthopedic surgery evaluation.  Dr. Posey Rea did subsequently clear the patient as low risk in clearance dated 07/28/2023.  Preop labs reviewed, mildly elevated creatinine 1.25, otherwise WNL.  DM2 well-controlled with A1c 5.9.  EKG 09/08/23: Normal sinus rhythm. Rate 61. Possible Left atrial enlargement. T wave abnormality, consider lateral ischemia  EKG 09/01/2013: Sinus  Rhythm. Rate 81.Left atrial enlargement. T-abnormality. Anterolateral ischemia.     Zannie Cove [facility]/Anesthesiology Phone [phone] 09/09/2023 12:37 PM

## 2023-09-11 MED ORDER — TRANEXAMIC ACID 1000 MG/10ML IV SOLN
2000.0000 mg | INTRAVENOUS | Status: DC
  Filled 2023-09-11: qty 20

## 2023-09-12 ENCOUNTER — Observation Stay (HOSPITAL_COMMUNITY)

## 2023-09-12 ENCOUNTER — Encounter (HOSPITAL_COMMUNITY)
Admission: RE | Disposition: A | Discharge status: Home / Self Care | Payer: Self-pay | Source: Home / Self Care | Attending: Orthopaedic Surgery

## 2023-09-12 ENCOUNTER — Ambulatory Visit (HOSPITAL_COMMUNITY): Payer: Self-pay | Admitting: Certified Registered Nurse Anesthetist

## 2023-09-12 ENCOUNTER — Encounter (HOSPITAL_COMMUNITY): Payer: Self-pay | Admitting: Orthopaedic Surgery

## 2023-09-12 ENCOUNTER — Other Ambulatory Visit: Payer: Self-pay

## 2023-09-12 ENCOUNTER — Other Ambulatory Visit (HOSPITAL_COMMUNITY): Payer: Self-pay

## 2023-09-12 ENCOUNTER — Other Ambulatory Visit: Payer: Self-pay | Admitting: Physician Assistant

## 2023-09-12 ENCOUNTER — Observation Stay (HOSPITAL_COMMUNITY)
Admission: RE | Admit: 2023-09-12 | Discharge: 2023-09-13 | Disposition: A | Discharge status: Home / Self Care | Payer: Commercial Managed Care - PPO | Attending: Orthopaedic Surgery | Admitting: Orthopaedic Surgery

## 2023-09-12 ENCOUNTER — Ambulatory Visit (HOSPITAL_COMMUNITY): Payer: Self-pay | Admitting: Physician Assistant

## 2023-09-12 DIAGNOSIS — E119 Type 2 diabetes mellitus without complications: Secondary | ICD-10-CM | POA: Insufficient documentation

## 2023-09-12 DIAGNOSIS — I10 Essential (primary) hypertension: Secondary | ICD-10-CM | POA: Insufficient documentation

## 2023-09-12 DIAGNOSIS — M1712 Unilateral primary osteoarthritis, left knee: Principal | ICD-10-CM | POA: Diagnosis present

## 2023-09-12 DIAGNOSIS — Z96652 Presence of left artificial knee joint: Secondary | ICD-10-CM

## 2023-09-12 DIAGNOSIS — Z471 Aftercare following joint replacement surgery: Secondary | ICD-10-CM | POA: Diagnosis not present

## 2023-09-12 DIAGNOSIS — G8918 Other acute postprocedural pain: Secondary | ICD-10-CM | POA: Diagnosis not present

## 2023-09-12 DIAGNOSIS — Z79899 Other long term (current) drug therapy: Secondary | ICD-10-CM | POA: Diagnosis not present

## 2023-09-12 DIAGNOSIS — R609 Edema, unspecified: Secondary | ICD-10-CM | POA: Diagnosis not present

## 2023-09-12 HISTORY — PX: TOTAL KNEE ARTHROPLASTY: SHX125

## 2023-09-12 LAB — GLUCOSE, CAPILLARY
Glucose-Capillary: 105 mg/dL — ABNORMAL HIGH (ref 70–99)
Glucose-Capillary: 117 mg/dL — ABNORMAL HIGH (ref 70–99)
Glucose-Capillary: 127 mg/dL — ABNORMAL HIGH (ref 70–99)
Glucose-Capillary: 122 mg/dL — ABNORMAL HIGH (ref 70–99)
Glucose-Capillary: 146 mg/dL — ABNORMAL HIGH (ref 70–99)

## 2023-09-12 SURGERY — ARTHROPLASTY, KNEE, TOTAL
Anesthesia: Monitor Anesthesia Care | Site: Knee | Laterality: Left

## 2023-09-12 MED ORDER — CHLORHEXIDINE GLUCONATE CLOTH 2 % EX PADS
6.0000 | MEDICATED_PAD | Freq: Every day | CUTANEOUS | Status: DC
  Administered 2023-09-12 – 2023-09-13 (×2): 6 via TOPICAL

## 2023-09-12 MED ORDER — ORAL CARE MOUTH RINSE: 15.0000 mL | Freq: Once | OROMUCOSAL | Status: AC

## 2023-09-12 MED ORDER — APIXABAN 2.5 MG PO TABS
ORAL_TABLET | ORAL | 0 refills | Status: DC
  Filled 2023-09-12: qty 60, 30d supply, fill #0

## 2023-09-12 MED ORDER — PROPOFOL 10 MG/ML IV BOLUS
INTRAVENOUS | Status: AC
  Filled 2023-09-12: qty 20

## 2023-09-12 MED ORDER — AMISULPRIDE (ANTIEMETIC) 5 MG/2ML IV SOLN: 10.0000 mg | Freq: Once | INTRAVENOUS | Status: DC | PRN

## 2023-09-12 MED ORDER — LOSARTAN POTASSIUM 50 MG PO TABS
50.0000 mg | ORAL_TABLET | Freq: Every day | ORAL | Status: DC
  Administered 2023-09-12: 50 mg via ORAL
  Filled 2023-09-12: qty 1

## 2023-09-12 MED ORDER — CARVEDILOL 12.5 MG PO TABS
12.5000 mg | ORAL_TABLET | Freq: Two times a day (BID) | ORAL | Status: DC
Start: 2023-09-12 — End: 2023-09-13
  Administered 2023-09-12 – 2023-09-13 (×2): 12.5 mg via ORAL
  Filled 2023-09-12 (×2): qty 1

## 2023-09-12 MED ORDER — OXYCODONE HCL 5 MG PO TABS: 5.0000 mg | ORAL_TABLET | Freq: Once | ORAL | Status: DC | PRN

## 2023-09-12 MED ORDER — PRONTOSAN WOUND IRRIGATION OPTIME
TOPICAL | Status: DC | PRN
  Administered 2023-09-12: 350 mL

## 2023-09-12 MED ORDER — POVIDONE-IODINE 10 % EX SWAB
2.0000 | Freq: Once | CUTANEOUS | Status: AC
  Administered 2023-09-12: 2 via TOPICAL

## 2023-09-12 MED ORDER — DOCUSATE SODIUM 100 MG PO CAPS
100.0000 mg | ORAL_CAPSULE | Freq: Two times a day (BID) | ORAL | Status: DC
  Administered 2023-09-12 (×2): 100 mg via ORAL
  Filled 2023-09-12 (×2): qty 1

## 2023-09-12 MED ORDER — FENTANYL CITRATE (PF) 100 MCG/2ML IJ SOLN
25.0000 ug | INTRAMUSCULAR | Status: DC | PRN
  Administered 2023-09-12 (×2): 50 ug via INTRAVENOUS

## 2023-09-12 MED ORDER — TRANEXAMIC ACID-NACL 1000-0.7 MG/100ML-% IV SOLN
1000.0000 mg | INTRAVENOUS | Status: DC
  Filled 2023-09-12: qty 100

## 2023-09-12 MED ORDER — ONDANSETRON HCL 4 MG/2ML IJ SOLN: 4.0000 mg | Freq: Four times a day (QID) | INTRAMUSCULAR | Status: DC | PRN

## 2023-09-12 MED ORDER — METOCLOPRAMIDE HCL 5 MG/ML IJ SOLN
5.0000 mg | Freq: Three times a day (TID) | INTRAMUSCULAR | Status: DC | PRN
  Administered 2023-09-12: 10 mg via INTRAVENOUS
  Filled 2023-09-12: qty 2

## 2023-09-12 MED ORDER — BUPIVACAINE-MELOXICAM ER 400-12 MG/14ML IJ SOLN
INTRAMUSCULAR | Status: AC
  Filled 2023-09-12: qty 1

## 2023-09-12 MED ORDER — OXYCODONE HCL 5 MG PO TABS
5.0000 mg | ORAL_TABLET | ORAL | Status: DC | PRN
  Administered 2023-09-12 – 2023-09-13 (×5): 10 mg via ORAL
  Filled 2023-09-12 (×5): qty 2

## 2023-09-12 MED ORDER — PHENYLEPHRINE HCL-NACL 20-0.9 MG/250ML-% IV SOLN
INTRAVENOUS | Status: DC | PRN
  Administered 2023-09-12: 40 ug/min via INTRAVENOUS

## 2023-09-12 MED ORDER — OXYCODONE HCL 5 MG PO TABS: 10.0000 mg | ORAL_TABLET | ORAL | Status: DC | PRN

## 2023-09-12 MED ORDER — AMLODIPINE BESYLATE 10 MG PO TABS: 10.0000 mg | ORAL_TABLET | Freq: Every day | ORAL | Status: DC

## 2023-09-12 MED ORDER — TRANEXAMIC ACID 1000 MG/10ML IV SOLN
INTRAVENOUS | Status: DC | PRN
  Administered 2023-09-12: 2000 mg via TOPICAL

## 2023-09-12 MED ORDER — DEXAMETHASONE SODIUM PHOSPHATE 10 MG/ML IJ SOLN
10.0000 mg | Freq: Once | INTRAMUSCULAR | Status: AC
  Administered 2023-09-13: 10 mg via INTRAVENOUS
  Filled 2023-09-12: qty 1

## 2023-09-12 MED ORDER — BUPIVACAINE-EPINEPHRINE (PF) 0.5% -1:200000 IJ SOLN
INTRAMUSCULAR | Status: DC | PRN
  Administered 2023-09-12: 30 mL via PERINEURAL

## 2023-09-12 MED ORDER — HYDROMORPHONE HCL 1 MG/ML IJ SOLN
0.5000 mg | INTRAMUSCULAR | Status: DC | PRN
  Administered 2023-09-12 – 2023-09-13 (×2): 1 mg via INTRAVENOUS
  Filled 2023-09-12 (×2): qty 1

## 2023-09-12 MED ORDER — MENTHOL 3 MG MT LOZG: 1.0000 | LOZENGE | OROMUCOSAL | Status: DC | PRN

## 2023-09-12 MED ORDER — FENTANYL CITRATE (PF) 100 MCG/2ML IJ SOLN
INTRAMUSCULAR | Status: AC
  Filled 2023-09-12: qty 2

## 2023-09-12 MED ORDER — VANCOMYCIN HCL 1000 MG IV SOLR
INTRAVENOUS | Status: DC | PRN
  Administered 2023-09-12: 1000 mg via TOPICAL

## 2023-09-12 MED ORDER — INSULIN ASPART 100 UNIT/ML IJ SOLN: 0.0000 [IU] | INTRAMUSCULAR | Status: DC | PRN

## 2023-09-12 MED ORDER — ACETAMINOPHEN 500 MG PO TABS
1000.0000 mg | ORAL_TABLET | Freq: Four times a day (QID) | ORAL | Status: AC
  Administered 2023-09-12 – 2023-09-13 (×4): 1000 mg via ORAL
  Filled 2023-09-12 (×4): qty 2

## 2023-09-12 MED ORDER — BUPIVACAINE-MELOXICAM ER 400-12 MG/14ML IJ SOLN
INTRAMUSCULAR | Status: DC | PRN
  Administered 2023-09-12: 400 mg

## 2023-09-12 MED ORDER — BUPIVACAINE IN DEXTROSE 0.75-8.25 % IT SOLN
INTRATHECAL | Status: DC | PRN
  Administered 2023-09-12: 1.6 mL via INTRATHECAL

## 2023-09-12 MED ORDER — METHOCARBAMOL 1000 MG/10ML IJ SOLN: 500.0000 mg | Freq: Four times a day (QID) | INTRAMUSCULAR | Status: DC | PRN

## 2023-09-12 MED ORDER — CHLORHEXIDINE GLUCONATE 0.12 % MT SOLN
15.0000 mL | Freq: Once | OROMUCOSAL | Status: AC
  Administered 2023-09-12: 15 mL via OROMUCOSAL
  Filled 2023-09-12: qty 15

## 2023-09-12 MED ORDER — MIDAZOLAM HCL 2 MG/2ML IJ SOLN
INTRAMUSCULAR | Status: DC | PRN
  Administered 2023-09-12: 2 mg via INTRAVENOUS

## 2023-09-12 MED ORDER — MUPIROCIN 2 % EX OINT
1.0000 | TOPICAL_OINTMENT | Freq: Two times a day (BID) | CUTANEOUS | Status: DC
  Administered 2023-09-12 – 2023-09-13 (×3): 1 via NASAL
  Filled 2023-09-12: qty 22

## 2023-09-12 MED ORDER — PROPOFOL 500 MG/50ML IV EMUL
INTRAVENOUS | Status: DC | PRN
  Administered 2023-09-12: 130 ug/kg/min via INTRAVENOUS

## 2023-09-12 MED ORDER — TRANEXAMIC ACID-NACL 1000-0.7 MG/100ML-% IV SOLN
1000.0000 mg | Freq: Once | INTRAVENOUS | Status: AC
  Administered 2023-09-12: 1000 mg via INTRAVENOUS

## 2023-09-12 MED ORDER — VANCOMYCIN HCL 1000 MG IV SOLR
INTRAVENOUS | Status: AC
  Filled 2023-09-12: qty 20

## 2023-09-12 MED ORDER — OXYCODONE HCL 5 MG/5ML PO SOLN: 5.0000 mg | Freq: Once | ORAL | Status: DC | PRN

## 2023-09-12 MED ORDER — METOCLOPRAMIDE HCL 5 MG PO TABS: 5.0000 mg | ORAL_TABLET | Freq: Three times a day (TID) | ORAL | Status: DC | PRN

## 2023-09-12 MED ORDER — DOXYCYCLINE HYCLATE 100 MG PO TABS
100.0000 mg | ORAL_TABLET | Freq: Two times a day (BID) | ORAL | Status: DC
  Administered 2023-09-12 (×2): 100 mg via ORAL
  Filled 2023-09-12 (×2): qty 1

## 2023-09-12 MED ORDER — HYDROXYZINE HCL 50 MG/ML IM SOLN
50.0000 mg | Freq: Once | INTRAMUSCULAR | Status: AC
  Administered 2023-09-12: 50 mg via INTRAMUSCULAR
  Filled 2023-09-12: qty 1

## 2023-09-12 MED ORDER — APIXABAN 2.5 MG PO TABS
2.5000 mg | ORAL_TABLET | Freq: Two times a day (BID) | ORAL | Status: DC
Start: 2023-09-13 — End: 2023-09-13
  Administered 2023-09-13: 2.5 mg via ORAL
  Filled 2023-09-12: qty 1

## 2023-09-12 MED ORDER — ACETAMINOPHEN 10 MG/ML IV SOLN: 1000.0000 mg | Freq: Once | INTRAVENOUS | Status: DC | PRN

## 2023-09-12 MED ORDER — MIDAZOLAM HCL 2 MG/2ML IJ SOLN
INTRAMUSCULAR | Status: AC
  Filled 2023-09-12: qty 2

## 2023-09-12 MED ORDER — LIDOCAINE 2% (20 MG/ML) 5 ML SYRINGE
INTRAMUSCULAR | Status: DC | PRN
  Administered 2023-09-12: 60 mg via INTRAVENOUS

## 2023-09-12 MED ORDER — SODIUM CHLORIDE 0.9 % IR SOLN
Status: DC | PRN
  Administered 2023-09-12: 1000 mL

## 2023-09-12 MED ORDER — PROPOFOL 10 MG/ML IV BOLUS
INTRAVENOUS | Status: DC | PRN
  Administered 2023-09-12: 30 mg via INTRAVENOUS

## 2023-09-12 MED ORDER — LACTATED RINGERS IV SOLN: INTRAVENOUS | Status: DC

## 2023-09-12 MED ORDER — ONDANSETRON HCL 4 MG/2ML IJ SOLN
INTRAMUSCULAR | Status: DC | PRN
  Administered 2023-09-12: 4 mg via INTRAVENOUS

## 2023-09-12 MED ORDER — CEFAZOLIN SODIUM-DEXTROSE 2-4 GM/100ML-% IV SOLN
2.0000 g | Freq: Four times a day (QID) | INTRAVENOUS | Status: AC
  Administered 2023-09-12 (×2): 2 g via INTRAVENOUS
  Filled 2023-09-12 (×2): qty 100

## 2023-09-12 MED ORDER — METHOCARBAMOL 500 MG PO TABS
500.0000 mg | ORAL_TABLET | Freq: Four times a day (QID) | ORAL | Status: DC | PRN
  Administered 2023-09-12 – 2023-09-13 (×3): 500 mg via ORAL
  Filled 2023-09-12 (×3): qty 1

## 2023-09-12 MED ORDER — CHLORHEXIDINE GLUCONATE 4 % EX SOLN
1.0000 | CUTANEOUS | 1 refills | Status: DC
  Filled 2023-09-12: qty 946, 30d supply, fill #0

## 2023-09-12 MED ORDER — CEFAZOLIN SODIUM-DEXTROSE 2-4 GM/100ML-% IV SOLN
2.0000 g | INTRAVENOUS | Status: AC
Start: 2023-09-12 — End: 2023-09-12
  Administered 2023-09-12: 2 g via INTRAVENOUS
  Filled 2023-09-12: qty 100

## 2023-09-12 MED ORDER — ACETAMINOPHEN 325 MG PO TABS: 325.0000 mg | ORAL_TABLET | Freq: Four times a day (QID) | ORAL | Status: DC | PRN

## 2023-09-12 MED ORDER — ONDANSETRON HCL 4 MG PO TABS: 4.0000 mg | ORAL_TABLET | Freq: Four times a day (QID) | ORAL | Status: DC | PRN

## 2023-09-12 MED ORDER — LACTATED RINGERS IV SOLN
INTRAVENOUS | Status: DC | PRN
Start: 2023-09-12 — End: 2023-09-12

## 2023-09-12 MED ORDER — MUPIROCIN 2 % EX OINT
1.0000 | TOPICAL_OINTMENT | Freq: Two times a day (BID) | CUTANEOUS | 0 refills | Status: AC
  Filled 2023-09-12: qty 66, 33d supply, fill #0

## 2023-09-12 MED ORDER — 0.9 % SODIUM CHLORIDE (POUR BTL) OPTIME
TOPICAL | Status: DC | PRN
  Administered 2023-09-12: 1000 mL

## 2023-09-12 MED ORDER — PHENOL 1.4 % MT LIQD: 1.0000 | OROMUCOSAL | Status: DC | PRN

## 2023-09-12 MED ORDER — OXYCODONE HCL ER 10 MG PO T12A
10.0000 mg | EXTENDED_RELEASE_TABLET | Freq: Two times a day (BID) | ORAL | Status: DC
  Administered 2023-09-12 (×2): 10 mg via ORAL
  Filled 2023-09-12 (×2): qty 1

## 2023-09-12 SURGICAL SUPPLY — 79 items
ALCOHOL 70% 16 OZ (MISCELLANEOUS) ×1 IMPLANT
BAG COUNTER SPONGE SURGICOUNT (BAG) IMPLANT
BAG DECANTER FOR FLEXI CONT (MISCELLANEOUS) ×1 IMPLANT
BANDAGE ESMARK 6X9 LF (GAUZE/BANDAGES/DRESSINGS) IMPLANT
BLADE SAG 18X100X1.27 (BLADE) ×1 IMPLANT
BLADE SAW SGTL 73X25 THK (BLADE) ×1 IMPLANT
BNDG ESMARK 6X9 LF (GAUZE/BANDAGES/DRESSINGS) IMPLANT
BOWL SMART MIX CTS (DISPOSABLE) ×1 IMPLANT
CLSR STERI-STRIP ANTIMIC 1/2X4 (GAUZE/BANDAGES/DRESSINGS) ×2 IMPLANT
COMP FEM KNEE STD PS 9 LT (Joint) ×1 IMPLANT
COMP PATELLA 3 PEG 35 (Joint) ×1 IMPLANT
COMP TIB PS G 0D LT (Joint) ×1 IMPLANT
COMPONENT FEM KNEE STD PS 9 LT (Joint) IMPLANT
COMPONENT PATELLA 3 PEG 35 (Joint) IMPLANT
COMPONET TIB PS G 0D LT (Joint) IMPLANT
COOLER ICEMAN CLASSIC (MISCELLANEOUS) ×1 IMPLANT
COVER SURGICAL LIGHT HANDLE (MISCELLANEOUS) ×1 IMPLANT
CUFF TOURN SGL QUICK 42 (TOURNIQUET CUFF) IMPLANT
CUFF TRNQT CYL 34X4.125X (TOURNIQUET CUFF) ×1 IMPLANT
DERMABOND ADVANCED .7 DNX12 (GAUZE/BANDAGES/DRESSINGS) ×1 IMPLANT
DRAPE EXTREMITY T 121X128X90 (DISPOSABLE) ×1 IMPLANT
DRAPE HALF SHEET 40X57 (DRAPES) ×1 IMPLANT
DRAPE INCISE IOBAN 66X45 STRL (DRAPES) ×1 IMPLANT
DRAPE POUCH INSTRU U-SHP 10X18 (DRAPES) ×1 IMPLANT
DRAPE SURG ORHT 6 SPLT 77X108 (DRAPES) IMPLANT
DRAPE U-SHAPE 47X51 STRL (DRAPES) ×2 IMPLANT
DRSG AQUACEL AG ADV 3.5X10 (GAUZE/BANDAGES/DRESSINGS) ×1 IMPLANT
DURAPREP 26ML APPLICATOR (WOUND CARE) ×3 IMPLANT
ELECT CAUTERY BLADE 6.4 (BLADE) ×1 IMPLANT
ELECT PENCIL ROCKER SW 15FT (MISCELLANEOUS) ×1 IMPLANT
ELECT REM PT RETURN 9FT ADLT (ELECTROSURGICAL) ×1 IMPLANT
ELECTRODE REM PT RTRN 9FT ADLT (ELECTROSURGICAL) ×1 IMPLANT
GLOVE BIO SURGEON STRL SZ7 (GLOVE) IMPLANT
GLOVE BIOGEL PI IND STRL 7.0 (GLOVE) ×2 IMPLANT
GLOVE BIOGEL PI IND STRL 7.5 (GLOVE) ×5 IMPLANT
GLOVE ECLIPSE 7.0 STRL STRAW (GLOVE) ×3 IMPLANT
GLOVE INDICATOR 7.0 STRL GRN (GLOVE) ×1 IMPLANT
GLOVE INDICATOR 7.5 STRL GRN (GLOVE) ×1 IMPLANT
GLOVE SURG SYN 7.5 E (GLOVE) ×5 IMPLANT
GLOVE SURG SYN 7.5 PF PI (GLOVE) ×2 IMPLANT
GLOVE SURG UNDER LTX SZ7.5 (GLOVE) ×2 IMPLANT
GLOVE SURG UNDER POLY LF SZ7 (GLOVE) ×2 IMPLANT
GOWN STRL REUS W/ TWL LRG LVL3 (GOWN DISPOSABLE) ×1 IMPLANT
GOWN STRL SURGICAL XL XLNG (GOWN DISPOSABLE) ×1 IMPLANT
GOWN TOGA ZIPPER T7+ PEEL AWAY (MISCELLANEOUS) ×2 IMPLANT
HOOD PEEL AWAY T7 (MISCELLANEOUS) ×1 IMPLANT
KIT BASIN OR (CUSTOM PROCEDURE TRAY) ×1 IMPLANT
KIT TURNOVER KIT B (KITS) ×1 IMPLANT
LINER TIB ASF PS GH/8-11 14 LT (Liner) IMPLANT
MANIFOLD NEPTUNE II (INSTRUMENTS) ×1 IMPLANT
MARKER SKIN DUAL TIP RULER LAB (MISCELLANEOUS) ×2 IMPLANT
NDL SPNL 18GX3.5 QUINCKE PK (NEEDLE) ×1 IMPLANT
NEEDLE SPNL 18GX3.5 QUINCKE PK (NEEDLE) ×1 IMPLANT
NS IRRIG 1000ML POUR BTL (IV SOLUTION) ×1 IMPLANT
PACK TOTAL JOINT (CUSTOM PROCEDURE TRAY) ×1 IMPLANT
PAD ARMBOARD POSITIONER FOAM (MISCELLANEOUS) ×2 IMPLANT
PAD COLD SHLDR WRAP-ON (PAD) ×1 IMPLANT
PIN DRILL HDLS TROCAR 75 4PK (PIN) IMPLANT
SCREW FEMALE HEX FIX 25X2.5 (ORTHOPEDIC DISPOSABLE SUPPLIES) IMPLANT
SET HNDPC FAN SPRY TIP SCT (DISPOSABLE) ×1 IMPLANT
SOLUTION PRONTOSAN WOUND 350ML (IRRIGATION / IRRIGATOR) ×1 IMPLANT
STAPLER VISISTAT 35W (STAPLE) IMPLANT
SUCTION TUBE FRAZIER 10FR DISP (SUCTIONS) ×1 IMPLANT
SUT ETHILON 2 0 FS 18 (SUTURE) IMPLANT
SUT MNCRL AB 3-0 PS2 27 (SUTURE) IMPLANT
SUT MNCRL AB 4-0 PS2 18 (SUTURE) IMPLANT
SUT STRATAFIX 1PDS 45CM VIOLET (SUTURE) IMPLANT
SUT VIC AB 0 CT1 27XBRD ANBCTR (SUTURE) ×2 IMPLANT
SUT VIC AB 1 CTX 27 (SUTURE) ×3 IMPLANT
SUT VIC AB 1 CTX36XBRD ANBCTR (SUTURE) IMPLANT
SUT VIC AB 2-0 CT1 TAPERPNT 27 (SUTURE) ×4 IMPLANT
SYR 50ML LL SCALE MARK (SYRINGE) ×2 IMPLANT
TOWEL GREEN STERILE (TOWEL DISPOSABLE) ×1 IMPLANT
TOWEL GREEN STERILE FF (TOWEL DISPOSABLE) ×1 IMPLANT
TRAY CATH INTERMITTENT SS 16FR (CATHETERS) IMPLANT
TUBE SUCT ARGYLE STRL (TUBING) ×1 IMPLANT
UNDERPAD 30X36 HEAVY ABSORB (UNDERPADS AND DIAPERS) ×1 IMPLANT
WARMER LAPAROSCOPE (MISCELLANEOUS) IMPLANT
YANKAUER SUCT BULB TIP NO VENT (SUCTIONS) ×2 IMPLANT

## 2023-09-12 NOTE — Progress Notes (Signed)
   Subjective:  Patient reports pain as mild.  Did well with PT.  Objective:   VITALS:   Vitals:   09/12/23 1040 09/12/23 1054 09/12/23 1101 09/12/23 1517  BP: 122/80  120/70 (!) 155/91  Pulse: (!) 59  (!) 54 78  Resp: 11 18 20 20   Temp: 97.6 F (36.4 C) 97.7 F (36.5 C) 97.7 F (36.5 C) (!) 97.5 F (36.4 C)  TempSrc:    Oral  SpO2: 97%  99% 98%  Weight:      Height:        Sensation intact distally Intact pulses distally Dorsiflexion/Plantar flexion intact   Lab Results  Component Value Date   WBC 5.1 09/08/2023   HGB 14.5 09/08/2023   HCT 44.5 09/08/2023   MCV 86.1 09/08/2023   PLT 263 09/08/2023     Assessment/Plan:  * Day of Surgery *   - Up with PT/OT - did well with PT today; anticipate will clear PT tomorrow - DVT ppx - SCDs, ambulation, eliquis POD 1 - WBAT operative extremity - wife to pick up eliquis from [facility] pharmacy sat morning prior to discharge - has urinated - home tomorrow  Glee Arvin 09/12/2023, 7:39 PM

## 2023-09-12 NOTE — Op Note (Signed)
 Total Knee Arthroplasty Procedure Note  Preoperative diagnosis: Left knee osteoarthritis  Postoperative diagnosis:same  Operative findings: Complete loss of joint space from all 3 compartments Excellent bone quality  Operative procedure: Left total knee arthroplasty. CPT [phone]  Surgeon: N. Glee Arvin, MD  Assist: Oneal Grout, PA-C; necessary for the timely completion of procedure and due to complexity of procedure.  Anesthesia: Spinal, regional  Tourniquet time: see anesthesia record  Implants used: Zimmer persona press-fit Femur: CR 9 Tibia: G Patella: 35 Polyethylene: 14 mm medial congruent  Indication: Dale Bishop is a 63 y.o. year old male with a history of knee pain. Having failed conservative management, the patient elected to proceed with a total knee arthroplasty.  We have reviewed the risk and benefits of the surgery and they elected to proceed after voicing understanding.  Procedure:  After informed consent was obtained and understanding of the risk were voiced including but not limited to bleeding, infection, damage to surrounding structures including nerves and vessels, blood clots, leg length inequality and the failure to achieve desired results, the operative extremity was marked with verbal confirmation of the patient in the holding area.   The patient was then brought to the operating room and transported to the operating room table in the supine position.  A tourniquet was applied to the operative extremity around the upper thigh. The operative limb was then prepped and draped in the usual sterile fashion and preoperative antibiotics were administered.  A time out was performed prior to the start of surgery confirming the correct extremity, preoperative antibiotic administration, as well as team members, implants and instruments available for the case. Correct surgical site was also confirmed with preoperative radiographs. The limb was then elevated  for exsanguination and the tourniquet was inflated. A midline incision was made and a standard medial parapatellar approach was performed.  The patella was everted which showed complete loss of articular cartilage.  The patella was resected from 25 mm to 15 mm and sized to a 35 mm.  A cover was placed on the patella for protection from retractors.  We then turned our attention to the femur.  The ACL was sacrificed. Start site was drilled in the femur and the intramedullary distal femoral cutting guide was placed, set at 5 degrees valgus, taking 10 mm of distal resection. The distal cut was made. Osteophytes were then removed.   Next, the proximal tibial cutting guide was placed with appropriate slope, varus/valgus alignment and depth of resection. A drop rod was attached to confirm that it was pointed to the second metatarsal.  The proximal tibial cut was made taking 4 mm off the low side. Gap blocks were then used to assess the extension gap and alignment, and appropriate soft tissue releases were performed. Attention was turned back to the femur, which was sized using the sizing guide to a size 9. Appropriate rotation of the femoral component was determined using epicondylar axis, Whiteside's line, and assessing the flexion gap under ligament tension. The appropriate size 4-in-1 cutting block was placed and cuts were made.  Posterior femoral osteophytes and uncapped bone were then removed with the curved osteotome.  Trial components were placed, and stability was checked in full extension, mid-flexion, and deep flexion.  The PCL was retained.  The patella tracked well without a lateral release. Trial components were then removed and tibial preparation performed.  The tibial bone quality was excellent for press-fit.   The tibial trial was pointed to the medial third  of the tibial tubercle.  The tibia was sized for a size G component.  The bony surfaces were irrigated with a pulse lavage and then dried. Final  components were placed.  The stability of the construct was re-evaluated throughout a range of motion and found to be acceptable. The trial liner was removed, the knee was copiously irrigated, and the knee was re-evaluated for any excess bone debris. The real polyethylene liner, 14 mm thick, was inserted and checked to ensure the locking mechanism had engaged appropriately. The tourniquet was deflated and hemostasis was achieved. The wound was irrigated with normal saline.  One gram of vancomycin powder was placed in the surgical bed.  Topical mixture of 0.25% bupivacaine and meloxicam was placed in the joint for postoperative pain.  Capsular closure was performed with a #1 statafix in flexion, subcutaneous fat closed with a 0 vicryl suture, then subcutaneous tissue closed with interrupted 2.0 vicryl suture. The skin was then closed with a 3.0 monocryl and steri strips. A sterile dressing was applied.  The patient was awakened in the operating room and taken to recovery in stable condition. All sponge, needle, and instrument counts were correct at the end of the case.  Tessa Lerner was necessary for opening, closing, retracting, limb positioning and overall facilitation and completion of the surgery.  Position: supine  Complications: none.  Time Out: performed   Drains/Packing: none  Estimated blood loss: minimal  Returned to Recovery Room: in good condition.   Antibiotics: yes   Mechanical VTE (DVT) Prophylaxis: sequential compression devices, TED thigh-high  Chemical VTE (DVT) Prophylaxis: eliquis POD 1  Fluid Replacement  Crystalloid: see anesthesia record Blood: none  FFP: none   Specimens Removed: 1 to pathology   Sponge and Instrument Count Correct? yes   PACU: portable radiograph - knee AP and Lateral   Plan/RTC: Return in 2 weeks for wound check.   Weight Bearing/Load Lower Extremity: full   Implant Name Type Inv. Item Serial No. Manufacturer Lot No. LRB No. Used Action   COMP TIB PS G 0D LT - [id number] Joint COMP TIB PS G 0D LT  ZIMMER RECON(ORTH,TRAU,BIO,SG) [id number] Left 1 Implanted  COMP PATELLA 3 PEG 35 - [id number] Joint COMP PATELLA 3 PEG 35  ZIMMER RECON(ORTH,TRAU,BIO,SG) [id number] Left 1 Implanted  COMP FEM KNEE STD PS 9 LT - [id number] Joint COMP FEM KNEE STD PS 9 LT  ZIMMER RECON(ORTH,TRAU,BIO,SG) [id number] Left 1 Implanted  LINER TIB ASF PS GH/8-11 14 LT - [id number] Liner LINER TIB ASF PS GH/8-11 14 LT  ZIMMER RECON(ORTH,TRAU,BIO,SG) [id number] Left 1 Implanted    N. Glee Arvin, MD [facility] 8:48 AM

## 2023-09-12 NOTE — Discharge Instructions (Addendum)

## 2023-09-12 NOTE — Anesthesia Procedure Notes (Signed)
 Anesthesia Regional Block: Adductor canal block   Pre-Anesthetic Checklist: , timeout performed,  Correct Patient, Correct Site, Correct Laterality,  Correct Procedure,, site marked,  Risks and benefits discussed,  Surgical consent,  Pre-op evaluation,  At surgeon's request and post-op pain management  Laterality: Left  Prep: chloraprep       Needles:  Injection technique: Single-shot  Needle Type: Echogenic Stimulator Needle     Needle Length: 10cm  Needle Gauge: 20     Additional Needles:   Procedures:,,,, ultrasound used (permanent image in chart),,    Narrative:  Start time: 09/12/2023 7:00 AM End time: 09/12/2023 7:10 AM Injection made incrementally with aspirations every 5 mL.  Performed by: Personally  Anesthesiologist: Leonides Grills, MD  Additional Notes: Functioning IV was confirmed and monitors were applied. A time-out was performed. Hand hygiene and sterile gloves were used. The thigh was placed in a frog-leg position and prepped in a sterile fashion. A 20ga Bbraun echogenic stimulator needle was placed using ultrasound guidance.  Negative aspiration and negative test dose prior to incremental administration of local anesthetic. The patient tolerated the procedure well.

## 2023-09-12 NOTE — Plan of Care (Signed)

## 2023-09-12 NOTE — Anesthesia Postprocedure Evaluation (Signed)
 Anesthesia Post Note  Patient: Dale Bishop  Procedure(s) Performed: LEFT TOTAL KNEE ARTHROPLASTY (Left: Knee)     Patient location during evaluation: PACU Anesthesia Type: Regional and Spinal Level of consciousness: awake Pain management: pain level controlled Vital Signs Assessment: post-procedure vital signs reviewed and stable Respiratory status: spontaneous breathing, nonlabored ventilation and respiratory function stable Cardiovascular status: blood pressure returned to baseline and stable Postop Assessment: no apparent nausea or vomiting Anesthetic complications: no   There were no known notable events for this encounter.  Last Vitals:  Vitals:   09/12/23 1054 09/12/23 1101  BP:  120/70  Pulse:  (!) 54  Resp: 18 20  Temp: 36.5 C 36.5 C  SpO2:  99%    Last Pain:  Vitals:   09/12/23 1406  TempSrc:   PainSc: 6                  Kajal Scalici P Orien Mayhall

## 2023-09-12 NOTE — Anesthesia Procedure Notes (Signed)
 Spinal  Patient location during procedure: OR Start time: 09/12/2023 7:25 AM End time: 09/12/2023 7:30 AM Reason for block: surgical anesthesia Staffing Performed: anesthesiologist  Anesthesiologist: Leonides Grills, MD Performed by: Leonides Grills, MD Authorized by: Leonides Grills, MD   Preanesthetic Checklist Completed: patient identified, IV checked, risks and benefits discussed, surgical consent, monitors and equipment checked, pre-op evaluation and timeout performed Spinal Block Patient position: sitting Prep: DuraPrep Patient monitoring: cardiac monitor, continuous pulse ox and blood pressure Approach: midline Location: L4-5 Injection technique: single-shot Needle Needle type: Pencan  Needle gauge: 24 G Needle length: 9 cm Assessment Sensory level: T10 Events: CSF return Additional Notes Functioning IV was confirmed and monitors were applied. Sterile prep and drape, including hand hygiene and sterile gloves were used. The patient was positioned and the spine was prepped. The skin was anesthetized with lidocaine.  Free flow of clear CSF was obtained prior to injecting local anesthetic into the CSF.  The spinal needle aspirated freely following injection.  The needle was carefully withdrawn.  The patient tolerated the procedure well.

## 2023-09-12 NOTE — H&P (Signed)
 PREOPERATIVE H&P  Chief Complaint: left knee osteoarthritis  HPI: Dale Bishop is a 63 y.o. male who presents for surgical treatment of left knee osteoarthritis.  He denies any changes in medical history.  Past Surgical History:  Procedure Laterality Date   COLONOSCOPY     NO PAST SURGERIES     Social History   Socioeconomic History   Marital status: Married    Spouse name: Not on file   Number of children: Not on file   Years of education: Not on file   Highest education level: Associate degree: academic program  Occupational History   Not on file  Tobacco Use   Smoking status: Never   Smokeless tobacco: Never  Vaping Use   Vaping status: Never Used  Substance and Sexual Activity   Alcohol use: No    Alcohol/week: 0.0 standard drinks of alcohol   Drug use: No   Sexual activity: Yes  Other Topics Concern   Not on file  Social History Narrative   Not on file   Social Drivers of Health   Financial Resource Strain: Low Risk  (06/30/2023)   Overall Financial Resource Strain (CARDIA)    Difficulty of Paying Living Expenses: Not hard at all  Food Insecurity: No Food Insecurity (06/30/2023)   Hunger Vital Sign    Worried About Running Out of Food in the Last Year: Never true    Ran Out of Food in the Last Year: Never true  Transportation Needs: No Transportation Needs (06/30/2023)   PRAPARE - Administrator, Civil Service (Medical): No    Lack of Transportation (Non-Medical): No  Physical Activity: Not on file  Stress: No Stress Concern Present (06/30/2023)   [employer] of Occupational Health - Occupational Stress Questionnaire    Feeling of Stress : Not at all  Social Connections: Moderately Integrated (06/30/2023)   Social Connection and Isolation Panel [NHANES]    Frequency of Communication with Friends and Family: More than three times a week    Frequency of Social Gatherings with Friends and Family: Once a week    Attends Religious Services:  More than 4 times per year    Active Member of [employer] or Organizations: No    Attends Engineer, structural: Not on file    Marital Status: Married   Family History  Problem Relation Age of Onset   Colon cancer Neg Hx    Allergies  Allergen Reactions   Avapro [Irbesartan]     nausea   Maxzide [Hydrochlorothiazide W-Triamterene]     Nausea. Pt can take HCTZ   Penicillins Other (See Comments)    Per pt: unknown   Prior to Admission medications   Medication Sig Start Date End Date Taking? Authorizing Provider  amLODipine (NORVASC) 10 MG tablet Take 1 tablet (10 mg total) by mouth daily. 02/25/23 12/09/23 Yes Plotnikov, Georgina Quint, MD  carvedilol (COREG) 12.5 MG tablet Take 12.5 mg by mouth 2 (two) times daily with a meal.   Yes [provider]  cholecalciferol (VITAMIN D3) 25 MCG (1000 UNIT) tablet Take 1,000 Units by mouth daily.   Yes [provider]  cyanocobalamin (VITAMIN B12) 1000 MCG tablet Take 1,000 mcg by mouth daily.   Yes [provider]  loratadine (CLARITIN) 10 MG tablet Take 10 mg by mouth daily.   Yes [provider]  losartan (COZAAR) 50 MG tablet Take 1 tablet (50 mg total) by mouth daily. 03/27/23 03/26/24 Yes Plotnikov, Georgina Quint, MD  meloxicam (MOBIC) 15 MG tablet Take 1 tablet (15 mg total) by mouth daily. Annual appt due in August must see provider for future refills 07/13/23  Yes Plotnikov, Georgina Quint, MD  rosuvastatin (CRESTOR) 10 MG tablet Take 1 tablet (10 mg total) by mouth daily. 02/11/23 02/06/24 Yes Plotnikov, Georgina Quint, MD  clindamycin (CLEOCIN T) 1 % external solution Apply topically 2 times daily. Apply to rash Patient taking differently: Apply 1 Application topically 2 (two) times daily as needed (acne). 02/07/22   Plotnikov, Georgina Quint, MD  docusate sodium (COLACE) 100 MG capsule Take 1 capsule (100 mg total) by mouth daily as needed. 09/02/23 09/01/24  Cristie Hem, PA-C  doxycycline (VIBRA-TABS) 100 MG tablet Take 1  tablet (100 mg total) by mouth 2 (two) times daily. To be taken after surgery 09/02/23   Cristie Hem, PA-C  methocarbamol (ROBAXIN-750) 750 MG tablet Take 1 tablet (750 mg total) by mouth every 8 (eight) hours as needed for muscle spasms. To be taken after surgery 09/02/23   Cristie Hem, PA-C  ondansetron (ZOFRAN) 4 MG tablet Take 1 tablet (4 mg total) by mouth every 8 (eight) hours as needed for nausea or vomiting. 09/02/23   Cristie Hem, PA-C  oxyCODONE-acetaminophen (PERCOCET) 5-325 MG tablet Take 1-2 tablets by mouth every 6 (six) hours as needed. To be taken after surgery 09/02/23   Cristie Hem, PA-C  tadalafil (CIALIS) 20 MG tablet Take as directed by mouth every 1-2 days as needed. 06/07/23   Plotnikov, Georgina Quint, MD  traMADol (ULTRAM) 50 MG tablet Take 1 tablet (50 mg total) by mouth every 6 (six) hours as needed. Patient not taking: Reported on 09/02/2023 07/02/23   Plotnikov, Georgina Quint, MD     Positive ROS: All other systems have been reviewed and were otherwise negative with the exception of those mentioned in the HPI and as above.  Physical Exam: General: Alert, no acute distress Cardiovascular: No pedal edema Respiratory: No cyanosis, no use of accessory musculature GI: abdomen soft Skin: No lesions in the area of chief complaint Neurologic: Sensation intact distally Psychiatric: Patient is competent for consent with normal mood and affect Lymphatic: no lymphedema  MUSCULOSKELETAL: exam stable  Assessment: left knee osteoarthritis  Plan: Plan for Procedure(s): LEFT TOTAL KNEE ARTHROPLASTY  The risks benefits and alternatives were discussed with the patient including but not limited to the risks of nonoperative treatment, versus surgical intervention including infection, bleeding, nerve injury,  blood clots, cardiopulmonary complications, morbidity, mortality, among others, and they were willing to proceed.   Glee Arvin, MD 09/12/2023 6:26 AM

## 2023-09-12 NOTE — Evaluation (Addendum)
 Physical Therapy Evaluation Patient Details Name: Dale Bishop MRN: [id number] DOB: 06-Mar-1961 Today's Date: 09/12/2023  History of Present Illness  Pt is a 63 y.o. male presenting 09/12/23 with L knee pain. S/p L TKA 09/12/23. PMHx DM, HLD, HTN  Clinical Impression  Pt is a 63 y.o. male presenting on 09/12/23 with above conditions and deficits below, see PT Problem List. PTA pt independently lived in a two level home that has 3 STE with his wife, worked, and drove. Currently the pt is mod I for bed mobility, CGA for transfers, and CGA- supervision for ambulation. Pt displays deficits in LE strength and ROM, pain, gait, balance, activity tolerance, and functional mobility and would benefit from continued acute PT. Given the pt's diagnosis, he should follow physician's recommendations for follow-up PT. Pt would benefit from continued mobility with nurses and mobility specialists until d/c. Will continue to follow acutely.            If plan is discharge home, recommend the following: A little help with walking and/or transfers;A little help with bathing/dressing/bathroom;Assistance with cooking/housework;Assist for transportation;Help with stairs or ramp for entrance   Can travel by private vehicle        Equipment Recommendations Rolling walker (2 wheels);BSC/3in1 (3N1 for use as shower chair)  Recommendations for Other Services       Functional Status Assessment Patient has had a recent decline in their functional status and demonstrates the ability to make significant improvements in function in a reasonable and predictable amount of time.     Precautions / Restrictions Precautions Precautions: Fall Recall of Precautions/Restrictions: Intact Restrictions Weight Bearing Restrictions Per Provider Order: No      Mobility  Bed Mobility Overal bed mobility: Modified Independent Bed Mobility: Supine to Sit     Supine to sit: Modified independent (Device/Increase time), HOB  elevated     General bed mobility comments: pt mod I for bed mobility s/t increased time and HOB elevation    Transfers Overall transfer level: Needs assistance Equipment used: Rolling walker (2 wheels) Transfers: Sit to/from Stand Sit to Stand: Contact guard assist           General transfer comment: pt is CGA for safety and hand placement on RW. Stands with L LE extended further out than R. Has both hands on EOB    Ambulation/Gait Ambulation/Gait assistance: Contact guard assist, Supervision Gait Distance (Feet): 80 Feet Assistive device: Rolling walker (2 wheels) Gait Pattern/deviations: Decreased stride length, Decreased stance time - left, Decreased step length - left, Decreased weight shift to left, Trunk flexed, Step-to pattern Gait velocity: reduced Gait velocity interpretation: 1.31 - 2.62 ft/sec, indicative of limited community ambulator   General Gait Details: pt walks with increased trunk flexion and with L LE leading. Requires cueing for RW management and maintaining RW on the ground, esp when turning. CGA initial 20-30 ft. Supervision the remaining distance  Stairs            Wheelchair Mobility     Tilt Bed    Modified Rankin (Stroke Patients Only)       Balance Overall balance assessment: Needs assistance Sitting-balance support: Single extremity supported, No upper extremity supported, Feet supported Sitting balance-Leahy Scale: Normal Sitting balance - Comments: pt sits with R lean away from L LE. L LE extended out to the side. No LOB Postural control: Right lateral lean Standing balance support: Single extremity supported, During functional activity, Bilateral upper extremity supported Standing balance-Leahy Scale: Fair Standing balance comment: pt  able to stand while urinating w/o RW with no LOB or sway. Increased BOS standing with and without RW                             Pertinent Vitals/Pain Pain Assessment Pain  Assessment: Faces Faces Pain Scale: Hurts even more Pain Location: L knee Pain Descriptors / Indicators: Grimacing, Guarding, Operative site guarding    Home Living Family/patient expects to be discharged to:: Private residence Living Arrangements: Spouse/significant other Available Help at Discharge: Family;Available 24 hours/day Type of Home: House Home Access: Stairs to enter Entrance Stairs-Rails: Can reach both;Left;Right Entrance Stairs-Number of Steps: 3 Alternate Level Stairs-Number of Steps: 14 Home Layout: Two level Home Equipment: None      Prior Function Prior Level of Function : Independent/Modified Independent;Working/employed;Driving             Mobility Comments: independent ADLs Comments: independent. Works in Education officer, environmental     Extremity/Trunk Assessment   Upper Extremity Assessment Upper Extremity Assessment: Defer to OT evaluation    Lower Extremity Assessment Lower Extremity Assessment: Generalized weakness;RLE deficits/detail;LLE deficits/detail RLE Deficits / Details: able to extend and flex against gravity LLE Deficits / Details: lacking 10-20 degs of ext against gravity. Able to flex knee to around 50-60 degs of flexion sitting EOB    Cervical / Trunk Assessment Cervical / Trunk Assessment: Normal  Communication   Communication Communication: No apparent difficulties    Cognition Arousal: Alert Behavior During Therapy: WFL for tasks assessed/performed   PT - Cognitive impairments: No apparent impairments                         Following commands: Intact       Cueing Cueing Techniques: Verbal cues     General Comments      Exercises     Assessment/Plan    PT Assessment Patient needs continued PT services  PT Problem List Decreased strength;Decreased range of motion;Decreased activity tolerance;Decreased balance;Decreased mobility;Decreased coordination;Decreased knowledge of use of DME;Decreased safety awareness;Pain        PT Treatment Interventions DME instruction;Gait training;Stair training;Therapeutic activities;Functional mobility training;Therapeutic exercise;Balance training;Neuromuscular re-education;Patient/family education;Modalities    PT Goals (Current goals can be found in the Care Plan section)  Acute Rehab PT Goals Patient Stated Goal: return home PT Goal Formulation: With patient Time For Goal Achievement: 09/26/23 Potential to Achieve Goals: Good    Frequency 7X/week     Co-evaluation               AM-PAC PT "6 Clicks" Mobility  Outcome Measure Help needed turning from your back to your side while in a flat bed without using bedrails?: None Help needed moving from lying on your back to sitting on the side of a flat bed without using bedrails?: None Help needed moving to and from a bed to a chair (including a wheelchair)?: A Little Help needed standing up from a chair using your arms (e.g., wheelchair or bedside chair)?: A Little Help needed to walk in hospital room?: A Little Help needed climbing 3-5 steps with a railing? : A Little 6 Click Score: 20    End of Session Equipment Utilized During Treatment: Gait belt Activity Tolerance: Patient tolerated treatment well Patient left: in chair;with call bell/phone within reach;with nursing/sitter in room Nurse Communication: Mobility status;Other (comment);Patient requests pain meds (equipment needs) PT Visit Diagnosis: Other abnormalities of gait and mobility (R26.89);Muscle weakness (generalized) (M62.81);Difficulty  in walking, not elsewhere classified (R26.2);Pain Pain - Right/Left: Left Pain - part of body: Knee    Time: 1610-9604 PT Time Calculation (min) (ACUTE ONLY): 29 min   Charges:   PT Evaluation $PT Eval Low Complexity: 1 Low PT Treatments $Therapeutic Activity: 8-22 mins PT General Charges $$ ACUTE PT VISIT: 1 Visit        [address], SPT   [place] 09/12/2023, 3:26 PM

## 2023-09-12 NOTE — Transfer of Care (Signed)
 Immediate Anesthesia Transfer of Care Note  Patient: Dale Bishop  Procedure(s) Performed: LEFT TOTAL KNEE ARTHROPLASTY (Left: Knee)  Patient Location: PACU  Anesthesia Type:MAC, Regional, and Spinal  Level of Consciousness: awake, alert , and oriented  Airway & Oxygen Therapy: Patient Spontanous Breathing and Patient connected to nasal cannula oxygen  Post-op Assessment: Report given to RN and Post -op Vital signs reviewed and stable  Post vital signs: Reviewed and stable   Last Vitals:  Vitals Value Taken Time  BP 100/70 09/12/23 0922  Temp 97.5   Pulse 61 09/12/23 0924  Resp 17 09/12/23 0924  SpO2 95 % 09/12/23 0924  Vitals shown include unfiled device data.  Last Pain:  Vitals:   09/12/23 0615  TempSrc: Oral  PainSc: 0-No pain         Complications: No notable events documented.

## 2023-09-13 ENCOUNTER — Other Ambulatory Visit (HOSPITAL_COMMUNITY): Payer: Self-pay

## 2023-09-13 DIAGNOSIS — M1712 Unilateral primary osteoarthritis, left knee: Secondary | ICD-10-CM | POA: Diagnosis not present

## 2023-09-13 DIAGNOSIS — I10 Essential (primary) hypertension: Secondary | ICD-10-CM | POA: Diagnosis not present

## 2023-09-13 DIAGNOSIS — Z79899 Other long term (current) drug therapy: Secondary | ICD-10-CM | POA: Diagnosis not present

## 2023-09-13 DIAGNOSIS — Z96652 Presence of left artificial knee joint: Secondary | ICD-10-CM | POA: Diagnosis not present

## 2023-09-13 DIAGNOSIS — E119 Type 2 diabetes mellitus without complications: Secondary | ICD-10-CM | POA: Diagnosis not present

## 2023-09-13 LAB — GLUCOSE, CAPILLARY: Glucose-Capillary: 109 mg/dL — ABNORMAL HIGH (ref 70–99)

## 2023-09-13 MED ORDER — HYDROXYZINE HCL 50 MG/ML IM SOLN
50.0000 mg | Freq: Four times a day (QID) | INTRAMUSCULAR | Status: DC | PRN
  Administered 2023-09-13: 50 mg via INTRAMUSCULAR
  Filled 2023-09-13: qty 1

## 2023-09-13 NOTE — Progress Notes (Signed)
   Subjective:  Patient reports pain as mild.  Worked with PT. Tolerating diet.  Objective:   VITALS:   Vitals:   09/12/23 2024 09/12/23 2248 09/13/23 0310 09/13/23 0718  BP: (!) 158/95 (!) 157/86 (!) 145/87 (!) 150/79  Pulse: 74 69 79 82  Resp: 20 20 20 20   Temp: 98 F (36.7 C) 98.6 F (37 C) 98.4 F (36.9 C) 98.6 F (37 C)  TempSrc: Oral Oral Oral Oral  SpO2: 97% 97% 96% 96%  Weight:      Height:        Sensation intact distally Intact pulses distally Dorsiflexion/Plantar flexion intact   Lab Results  Component Value Date   WBC 5.1 09/08/2023   HGB 14.5 09/08/2023   HCT 44.5 09/08/2023   MCV 86.1 09/08/2023   PLT 263 09/08/2023     Assessment/Plan:  1 Day Post-Op   - Up with PT/OT - did well with PT today; dc home today - DVT ppx - SCDs, ambulation, eliquis POD 1 - WBAT operative extremity - wife to pick up eliquis from [facility] pharmacy sat morning prior to discharge - has urinated - home today  [facility] 09/13/2023, 7:56 AM

## 2023-09-13 NOTE — Progress Notes (Signed)
 Physical Therapy Treatment Patient Details Name: Dale Bishop MRN: [id number] DOB: [birth year] Today's Date: 09/13/2023   History of Present Illness Pt is a 63 y.o. male presenting 09/12/23 with L knee pain. S/p L TKA 09/12/23. PMHx DM, HLD, HTN    PT Comments  Pt mobilizing well s/p L TKR. Pt ambulatory for good hallway distance, proficiently navigated a flight of steps with min PT cuing and guarding for pt safety, and successfully performed TKR exercises (handout administered). Pt met all acute PT goals and has no further questions, appropriate to d/c home from a PT perspective.     If plan is discharge home, recommend the following: A little help with walking and/or transfers;A little help with bathing/dressing/bathroom;Assistance with cooking/housework;Assist for transportation;Help with stairs or ramp for entrance   Can travel by private vehicle        Equipment Recommendations  Rolling walker (2 wheels);BSC/3in1    Recommendations for Other Services       Precautions / Restrictions Precautions Precautions: Fall Recall of Precautions/Restrictions: Intact Restrictions Weight Bearing Restrictions Per Provider Order: No     Mobility  Bed Mobility Overal bed mobility: Modified Independent             General bed mobility comments: cues for using RLE under LLE if needed for pain    Transfers Overall transfer level: Modified independent Equipment used: Rolling walker (2 wheels)               General transfer comment: slow to rise, min cues for hand placement    Ambulation/Gait Ambulation/Gait assistance: Modified independent (Device/Increase time) Gait Distance (Feet): 140 Feet Assistive device: Rolling walker (2 wheels) Gait Pattern/deviations: Decreased stride length, Decreased stance time - left, Decreased step length - left, Decreased weight shift to left, Trunk flexed, Step-through pattern Gait velocity: decr     General Gait Details: cues for relaxing  shoulder girdle, placement in RW   Stairs Stairs: Yes Stairs assistance: Supervision Stair Management: One rail Left, One rail Right, Step to pattern, Sideways Number of Stairs: 12 General stair comments: for safety, cues for sequencing (up with the good leg, down with the bad leg). Practice with only L rail then only R rail as pt's stairs have a landing where the railing switches sides.   Wheelchair Mobility     Tilt Bed    Modified Rankin (Stroke Patients Only)       Balance Overall balance assessment: Needs assistance Sitting-balance support: No upper extremity supported, Feet supported Sitting balance-Leahy Scale: Good     Standing balance support: During functional activity, Bilateral upper extremity supported Standing balance-Leahy Scale: Fair                              Hotel manager: No apparent difficulties  Cognition Arousal: Alert Behavior During Therapy: WFL for tasks assessed/performed   PT - Cognitive impairments: No apparent impairments                         Following commands: Intact      Cueing Cueing Techniques: Verbal cues  Exercises Total Joint Exercises Ankle Circles/Pumps: AROM, Both, 5 reps, Supine Quad Sets: AROM, Left, 5 reps, Supine Short Arc Quad: AROM, Left, 5 reps, Supine Heel Slides: AAROM, Left, 5 reps, Supine Hip ABduction/ADduction: AROM, Left, 5 reps, Supine Straight Leg Raises: AROM, Left, 5 reps, Supine Goniometric ROM: knee aarom approximately 5-60 deg  General Comments  Home walking program: up and walking 1x/hour during waking hours for short household distances with supervision of family, to promote circulation, activity tolerance, and strength maintenance.       Pertinent Vitals/Pain Pain Assessment Pain Assessment: 0-10 Pain Score: 8  Pain Location: L knee Pain Descriptors / Indicators: Operative site guarding, Sore Pain Intervention(s): Limited activity  within patient's tolerance, Monitored during session, Repositioned    Home Living                          Prior Function            PT Goals (current goals can now be found in the care plan section) Acute Rehab PT Goals Patient Stated Goal: return home PT Goal Formulation: With patient Time For Goal Achievement: 09/26/23 Potential to Achieve Goals: Good Progress towards PT goals: Progressing toward goals    Frequency    7X/week      PT Plan      Co-evaluation              AM-PAC PT "6 Clicks" Mobility   Outcome Measure  Help needed turning from your back to your side while in a flat bed without using bedrails?: None Help needed moving from lying on your back to sitting on the side of a flat bed without using bedrails?: None Help needed moving to and from a bed to a chair (including a wheelchair)?: A Little Help needed standing up from a chair using your arms (e.g., wheelchair or bedside chair)?: A Little Help needed to walk in hospital room?: A Little Help needed climbing 3-5 steps with a railing? : A Little 6 Click Score: 20    End of Session   Activity Tolerance: Patient tolerated treatment well Patient left: with call bell/phone within reach;in bed Nurse Communication: Mobility status PT Visit Diagnosis: Other abnormalities of gait and mobility (R26.89);Muscle weakness (generalized) (M62.81);Difficulty in walking, not elsewhere classified (R26.2);Pain Pain - Right/Left: Left Pain - part of body: Knee     Time: 0805-0822 PT Time Calculation (min) (ACUTE ONLY): 17 min  Charges:    $Gait Training: 8-22 mins PT General Charges $$ ACUTE PT VISIT: 1 Visit                     Marye Round, PT DPT Acute Rehabilitation Services Secure Chat Preferred  Office [phone]    Aidenn Skellenger Sheliah Plane 09/13/2023, 9:16 AM

## 2023-09-13 NOTE — Care Management (Signed)
 Patient with order to DC to home today. Unit staff to provide DME needed for home.     Patient set up from the office prior to admission with [facility] services through [facility].    Liaison for agency has been notified of DC. Information added to AVS  Patient will have family/ friends provide transportation home. No other TOC needs identified for DC

## 2023-09-13 NOTE — Discharge Summary (Signed)
 Patient ID: Dale Bishop MRN: [id number] DOB/AGE: 62-Mar-1962 62 y.o.  Admit date: 09/12/2023 Discharge date: 09/13/2023  Admission Diagnoses:  Primary osteoarthritis of left knee  Discharge Diagnoses:  Principal Problem:   Primary osteoarthritis of left knee Active Problems:   Status post total left knee replacement   Past Medical History:  Diagnosis Date   Diabetes mellitus without complication (HCC)    Hyperlipidemia    Hypertension     Surgeries: Procedure(s): LEFT TOTAL KNEE ARTHROPLASTY on 09/12/2023   Consultants (if any):   Discharged Condition: Improved  Hospital Course: Raequon Catanzaro is an 63 y.o. male who was admitted 09/12/2023 with a diagnosis of Primary osteoarthritis of left knee and went to the operating room on 09/12/2023 and underwent the above named procedures.    He was given perioperative antibiotics:  Anti-infectives (From admission, onward)    Start     Dose/Rate Route Frequency Ordered Stop   09/12/23 1330  ceFAZolin (ANCEF) IVPB 2g/100 mL premix        2 g 200 mL/hr over 30 Minutes Intravenous Every 6 hours 09/12/23 1039 09/13/23 0839   09/12/23 1200  doxycycline (VIBRA-TABS) tablet 100 mg       Note to Pharmacy: To be taken after surgery     100 mg Oral 2 times daily 09/12/23 1039     09/12/23 0819  vancomycin (VANCOCIN) powder  Status:  Discontinued          As needed 09/12/23 0820 09/12/23 0917   09/12/23 0600  ceFAZolin (ANCEF) IVPB 2g/100 mL premix        2 g 200 mL/hr over 30 Minutes Intravenous On call to O.R. 09/12/23 4098 09/12/23 0757     .  He was given sequential compression devices, early ambulation, and appropriate chemoprophylaxis for DVT prophylaxis.  He benefited maximally from the hospital stay and there were no complications.    Recent vital signs:  Vitals:   09/13/23 0310 09/13/23 0718  BP: (!) 145/87 (!) 150/79  Pulse: 79 82  Resp: 20 20  Temp: 98.4 F (36.9 C) 98.6 F (37 C)  SpO2: 96% 96%    Recent  laboratory studies:  Lab Results  Component Value Date   HGB 14.5 09/08/2023   HGB 13.9 02/10/2023   HGB 14.0 10/25/2020   Lab Results  Component Value Date   WBC 5.1 09/08/2023   PLT 263 09/08/2023   No results found for: "INR" Lab Results  Component Value Date   NA 141 09/08/2023   K 4.4 09/08/2023   CL 106 09/08/2023   CO2 28 09/08/2023   BUN 13 09/08/2023   CREATININE 1.25 (H) 09/08/2023   GLUCOSE 116 (H) 09/08/2023    Discharge Medications:   Allergies as of 09/13/2023       Reactions   Avapro [irbesartan]    nausea   Maxzide [hydrochlorothiazide W-triamterene]    Nausea. Pt can take HCTZ   Penicillins Other (See Comments)   Per pt: unknown        Medication List     STOP taking these medications    meloxicam 15 MG tablet Commonly known as: MOBIC   traMADol 50 MG tablet Commonly known as: ULTRAM       TAKE these medications    amLODipine 10 MG tablet Commonly known as: NORVASC Take 1 tablet (10 mg total) by mouth daily.   carvedilol 12.5 MG tablet Commonly known as: COREG Take 12.5 mg by mouth 2 (two) times daily with  a meal.   chlorhexidine 4 % external liquid Commonly known as: HIBICLENS Apply 15 mLs (1 Application total) topically as directed for 30 doses. Use as directed daily for 5 days every other week for 6 weeks.   cholecalciferol 25 MCG (1000 UNIT) tablet Commonly known as: VITAMIN D3 Take 1,000 Units by mouth daily.   clindamycin 1 % external solution Commonly known as: CLEOCIN T Apply topically 2 times daily. Apply to rash   cyanocobalamin 1000 MCG tablet Commonly known as: VITAMIN B12 Take 1,000 mcg by mouth daily.   docusate sodium 100 MG capsule Commonly known as: Colace Take 1 capsule (100 mg total) by mouth daily as needed.   doxycycline 100 MG tablet Commonly known as: VIBRA-TABS Take 1 tablet (100 mg total) by mouth 2 (two) times daily. To be taken after surgery   Eliquis 2.5 MG Tabs tablet Generic drug:  apixaban To be taken to prevent blood clots   loratadine 10 MG tablet Commonly known as: CLARITIN Take 10 mg by mouth daily.   losartan 50 MG tablet Commonly known as: COZAAR Take 1 tablet (50 mg total) by mouth daily.   methocarbamol 750 MG tablet Commonly known as: Robaxin-750 Take 1 tablet (750 mg total) by mouth every 8 (eight) hours as needed for muscle spasms. To be taken after surgery   mupirocin ointment 2 % Commonly known as: BACTROBAN Place 1 Application into the nose 2 (two) times daily for 60 doses. Use as directed 2 times daily for 5 days every other week for 6 weeks.   ondansetron 4 MG tablet Commonly known as: Zofran Take 1 tablet (4 mg total) by mouth every 8 (eight) hours as needed for nausea or vomiting.   oxyCODONE-acetaminophen 5-325 MG tablet Commonly known as: Percocet Take 1-2 tablets by mouth every 6 (six) hours as needed. To be taken after surgery   rosuvastatin 10 MG tablet Commonly known as: CRESTOR Take 1 tablet (10 mg total) by mouth daily.   tadalafil 20 MG tablet Commonly known as: CIALIS Take as directed by mouth every 1-2 days as needed.               Durable Medical Equipment  (From admission, onward)           Start     Ordered   09/12/23 1039  DME Walker rolling  Once       Question Answer Comment  Walker: With 5 Inch Wheels   Patient needs a walker to treat with the following condition Status post left partial knee replacement      09/12/23 1039   09/12/23 1039  DME 3 n 1  Once        09/12/23 1039   09/12/23 1039  DME Bedside commode  Once       Question:  Patient needs a bedside commode to treat with the following condition  Answer:  Status post left partial knee replacement   09/12/23 1039            Diagnostic Studies: DG Knee Left Port Result Date: 09/12/2023 CLINICAL DATA:  Postop left knee. EXAM: PORTABLE LEFT KNEE - 1-2 VIEW COMPARISON:  None Available. FINDINGS: Left knee arthroplasty in expected  alignment. No periprosthetic lucency or fracture. Recent postsurgical change includes air and edema in the soft tissues and joint space. IMPRESSION: Left knee arthroplasty without immediate postoperative complication. Electronically Signed   By: Narda Rutherford M.D.   On: 09/12/2023 10:14    Disposition: Discharge disposition:  01-Home or Self Care          Follow-up Information     [facility] Follow up.   Why: [facility] will contact you for the first home visit Contact information: [phone]        Cristie Hem, PA-C Follow up.   Specialty: Orthopedic Surgery Contact information: [address] [place] [address] [phone]                  Signed: Huel Cote 09/13/2023, 3:27 PM

## 2023-09-13 NOTE — Progress Notes (Addendum)
Patient is discharged from room 3C05 at this time. Alert and in stable condition. IV site d/c'd and instructions read to patient and spouse with understanding verbalized and all questions answered. Left unit via wheelchair with all belongings at side.  

## 2023-09-13 NOTE — Discharge Planning (Signed)
 Patient ID: Dale Bishop MRN: [id number] DOB/AGE: 1960-07-20 63 y.o.  Admit date: 09/12/2023 Discharge date: 09/13/2023  Admission Diagnoses:  Primary osteoarthritis of left knee  Discharge Diagnoses:  Principal Problem:   Primary osteoarthritis of left knee Active Problems:   Status post total left knee replacement   Past Medical History:  Diagnosis Date   Diabetes mellitus without complication (HCC)    Hyperlipidemia    Hypertension     Surgeries: Procedure(s): LEFT TOTAL KNEE ARTHROPLASTY on 09/12/2023   Consultants (if any):   Discharged Condition: Improved  Hospital Course: Dale Bishop is an 63 y.o. male who was admitted 09/12/2023 with a diagnosis of Primary osteoarthritis of left knee and went to the operating room on 09/12/2023 and underwent the above named procedures.    He was given perioperative antibiotics:  Anti-infectives (From admission, onward)    Start     Dose/Rate Route Frequency Ordered Stop   09/12/23 1330  ceFAZolin (ANCEF) IVPB 2g/100 mL premix        2 g 200 mL/hr over 30 Minutes Intravenous Every 6 hours 09/12/23 1039 09/12/23 1918   09/12/23 1200  doxycycline (VIBRA-TABS) tablet 100 mg       Note to Pharmacy: To be taken after surgery     100 mg Oral 2 times daily 09/12/23 1039     09/12/23 0819  vancomycin (VANCOCIN) powder  Status:  Discontinued          As needed 09/12/23 0820 09/12/23 0917   09/12/23 0600  ceFAZolin (ANCEF) IVPB 2g/100 mL premix        2 g 200 mL/hr over 30 Minutes Intravenous On call to O.R. 09/12/23 4098 09/12/23 0757     .  He was given sequential compression devices, early ambulation, and appropriate chemoprophylaxis for DVT prophylaxis.  He benefited maximally from the hospital stay and there were no complications.    Recent vital signs:  Vitals:   09/13/23 0310 09/13/23 0718  BP: (!) 145/87 (!) 150/79  Pulse: 79 82  Resp: 20 20  Temp: 98.4 F (36.9 C) 98.6 F (37 C)  SpO2: 96% 96%    Recent  laboratory studies:  Lab Results  Component Value Date   HGB 14.5 09/08/2023   HGB 13.9 02/10/2023   HGB 14.0 10/25/2020   Lab Results  Component Value Date   WBC 5.1 09/08/2023   PLT 263 09/08/2023   No results found for: "INR" Lab Results  Component Value Date   NA 141 09/08/2023   K 4.4 09/08/2023   CL 106 09/08/2023   CO2 28 09/08/2023   BUN 13 09/08/2023   CREATININE 1.25 (H) 09/08/2023   GLUCOSE 116 (H) 09/08/2023    Discharge Medications:   Allergies as of 09/13/2023       Reactions   Avapro [irbesartan]    nausea   Maxzide [hydrochlorothiazide W-triamterene]    Nausea. Pt can take HCTZ   Penicillins Other (See Comments)   Per pt: unknown        Medication List     STOP taking these medications    meloxicam 15 MG tablet Commonly known as: MOBIC   traMADol 50 MG tablet Commonly known as: ULTRAM       TAKE these medications    amLODipine 10 MG tablet Commonly known as: NORVASC Take 1 tablet (10 mg total) by mouth daily.   carvedilol 12.5 MG tablet Commonly known as: COREG Take 12.5 mg by mouth 2 (two) times daily with  a meal.   chlorhexidine 4 % external liquid Commonly known as: HIBICLENS Apply 15 mLs (1 Application total) topically as directed for 30 doses. Use as directed daily for 5 days every other week for 6 weeks.   cholecalciferol 25 MCG (1000 UNIT) tablet Commonly known as: VITAMIN D3 Take 1,000 Units by mouth daily.   clindamycin 1 % external solution Commonly known as: CLEOCIN T Apply topically 2 times daily. Apply to rash   cyanocobalamin 1000 MCG tablet Commonly known as: VITAMIN B12 Take 1,000 mcg by mouth daily.   docusate sodium 100 MG capsule Commonly known as: Colace Take 1 capsule (100 mg total) by mouth daily as needed.   doxycycline 100 MG tablet Commonly known as: VIBRA-TABS Take 1 tablet (100 mg total) by mouth 2 (two) times daily. To be taken after surgery   Eliquis 2.5 MG Tabs tablet Generic drug:  apixaban To be taken to prevent blood clots   loratadine 10 MG tablet Commonly known as: CLARITIN Take 10 mg by mouth daily.   losartan 50 MG tablet Commonly known as: COZAAR Take 1 tablet (50 mg total) by mouth daily.   methocarbamol 750 MG tablet Commonly known as: Robaxin-750 Take 1 tablet (750 mg total) by mouth every 8 (eight) hours as needed for muscle spasms. To be taken after surgery   mupirocin ointment 2 % Commonly known as: BACTROBAN Place 1 Application into the nose 2 (two) times daily for 60 doses. Use as directed 2 times daily for 5 days every other week for 6 weeks.   ondansetron 4 MG tablet Commonly known as: Zofran Take 1 tablet (4 mg total) by mouth every 8 (eight) hours as needed for nausea or vomiting.   oxyCODONE-acetaminophen 5-325 MG tablet Commonly known as: Percocet Take 1-2 tablets by mouth every 6 (six) hours as needed. To be taken after surgery   rosuvastatin 10 MG tablet Commonly known as: CRESTOR Take 1 tablet (10 mg total) by mouth daily.   tadalafil 20 MG tablet Commonly known as: CIALIS Take as directed by mouth every 1-2 days as needed.               Durable Medical Equipment  (From admission, onward)           Start     Ordered   09/12/23 1039  DME Walker rolling  Once       Question Answer Comment  Walker: With 5 Inch Wheels   Patient needs a walker to treat with the following condition Status post left partial knee replacement      09/12/23 1039   09/12/23 1039  DME 3 n 1  Once        09/12/23 1039   09/12/23 1039  DME Bedside commode  Once       Question:  Patient needs a bedside commode to treat with the following condition  Answer:  Status post left partial knee replacement   09/12/23 1039            Diagnostic Studies: DG Knee Left Port Result Date: 09/12/2023 CLINICAL DATA:  Postop left knee. EXAM: PORTABLE LEFT KNEE - 1-2 VIEW COMPARISON:  None Available. FINDINGS: Left knee arthroplasty in expected  alignment. No periprosthetic lucency or fracture. Recent postsurgical change includes air and edema in the soft tissues and joint space. IMPRESSION: Left knee arthroplasty without immediate postoperative complication. Electronically Signed   By: Narda Rutherford M.D.   On: 09/12/2023 10:14    Disposition:  Follow-up Information     [facility] Follow up.   Why: [facility] will contact you for the first home visit Contact information: [phone]        Cristie Hem, PA-C Follow up.   Specialty: Orthopedic Surgery Contact information: [address] [place] [address] [phone]                  Signed: Huel Cote 09/13/2023, 7:57 AM

## 2023-09-13 NOTE — Evaluation (Signed)
 Occupational Therapy Evaluation Patient Details Name: Dale Bishop MRN: [id number] DOB: 12/18/1960 Today's Date: 09/13/2023   History of Present Illness   Pt is a 63 y.o. male presenting 09/12/23 with L knee pain. S/p L TKA 09/12/23. 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 Upon eval, pt performing UB ADL independently and LB ADL with MIN A. Pt wife is able to assist at home. Pt educated and demonstrating use of compensatory techniques for LB ADL, and shower transfers within precautions. Encouraged to avoid pivoting on knee with good compliance after one verbal cue. All education provided and questions answered. Recommending discharge home with family support. OT to sign off. Please re-consult if change in status.       If plan is discharge home, recommend the following:   A little help with bathing/dressing/bathroom;Assistance with cooking/housework;Assist for transportation;Help with stairs or ramp for entrance     Functional Status Assessment   Patient has had a recent decline in their functional status and demonstrates the ability to make significant improvements in function in a reasonable and predictable amount of time.     Equipment Recommendations   BSC/3in1     Recommendations for Other Services         Precautions/Restrictions   Precautions Precautions: Fall Recall of Precautions/Restrictions: Intact Restrictions Weight Bearing Restrictions Per Provider Order: No     Mobility Bed Mobility Overal bed mobility: Needs Assistance             General bed mobility comments: NT; pt received seated EOB upon arrival    Transfers Overall transfer level: Modified independent Equipment used: Rolling walker (2 wheels) Transfers: Sit to/from Stand Sit to Stand: Modified independent (Device/Increase time)           General  transfer comment: Pt required increased time to complete STS and verbal cues for hand placement. No physical assistance required      Balance Overall balance assessment: Needs assistance Sitting-balance support: No upper extremity supported, Feet supported Sitting balance-Leahy Scale: Good Sitting balance - Comments: Pt completed functional bending and reaching to engage in lower body dressing task   Standing balance support: During functional activity, Bilateral upper extremity supported Standing balance-Leahy Scale: Fair Standing balance comment: Pt able to stand unsupported to complete clothing management. No LOB observed                           ADL either performed or assessed with clinical judgement   ADL Overall ADL's : Needs assistance/impaired Eating/Feeding: Independent   Grooming: Modified independent;Standing   Upper Body Bathing: Modified independent;Sitting   Lower Body Bathing: Minimal assistance;Sitting/lateral leans;Sit to/from stand   Upper Body Dressing : Independent;Sitting   Lower Body Dressing: Minimal assistance;Sit to/from stand   Toilet Transfer: Ambulation;Regular Toilet;Rolling walker (2 wheels);Modified Independent   Toileting- Clothing Manipulation and Hygiene: Sit to/from stand;Modified independent   Tub/ Shower Transfer: Minimal assistance;Rolling walker (2 wheels);Shower seat;Ambulation   Functional mobility during ADLs: Modified independent;Rolling walker (2 wheels) General ADL Comments: Pt with pain in L Knee. Pt engaged in simulated tub transfer using RW. Pt required MIN A to complete task. Pt educated on having wife present when complete transfer for safety. Pt verbalized understanding.     Vision Baseline Vision/History: 1 Wears glasses Ability to See in Adequate Light: 0 Adequate Patient Visual Report:  No change from baseline Vision Assessment?: No apparent visual deficits     Perception Perception: Within Functional  Limits       Praxis Praxis: Not tested       Pertinent Vitals/Pain Pain Assessment Pain Assessment: 0-10 Pain Score: 8  Pain Location: L knee Pain Descriptors / Indicators: Operative site guarding, Sore Pain Intervention(s): Premedicated before session, Limited activity within patient's tolerance     Extremity/Trunk Assessment Upper Extremity Assessment Upper Extremity Assessment: Overall WFL for tasks assessed   Lower Extremity Assessment Lower Extremity Assessment: Defer to PT evaluation   Cervical / Trunk Assessment Cervical / Trunk Assessment: Normal   Communication Communication Communication: No apparent difficulties   Cognition Arousal: Alert Behavior During Therapy: WFL for tasks assessed/performed Cognition: No apparent impairments                               Following commands: Intact       Cueing  General Comments   Cueing Techniques: Verbal cues  VSS on RA   Exercises     Shoulder Instructions      Home Living Family/patient expects to be discharged to:: Private residence Living Arrangements: Spouse/significant other Available Help at Discharge: Family;Available 24 hours/day Type of Home: House Home Access: Stairs to enter [employer] of Steps: 3 Entrance Stairs-Rails: Can reach both;Left;Right Home Layout: Two level Alternate Level Stairs-Number of Steps: 14 Alternate Level Stairs-Rails: Can reach both;Left;Right Bathroom Shower/Tub: Chief Strategy Officer: Handicapped height Bathroom Accessibility: Yes How Accessible: Accessible via walker Home Equipment: None          Prior Functioning/Environment Prior Level of Function : Independent/Modified Independent;Working/employed;Driving             Mobility Comments: independent ADLs Comments: independent. Works in Human resources officer Problem List: Decreased activity tolerance;Impaired balance (sitting and/or standing);Decreased knowledge of use  of DME or AE;Pain   OT Treatment/Interventions:        OT Goals(Current goals can be found in the care plan section)   Acute Rehab OT Goals Patient Stated Goal: to go home OT Goal Formulation: With patient Time For Goal Achievement: 09/13/23 Potential to Achieve Goals: Good   OT Frequency:       Co-evaluation              AM-PAC OT "6 Clicks" Daily Activity     Outcome Measure Help from another person eating meals?: None Help from another person taking care of personal grooming?: None Help from another person toileting, which includes using toliet, bedpan, or urinal?: None Help from another person bathing (including washing, rinsing, drying)?: A Little Help from another person to put on and taking off regular upper body clothing?: None Help from another person to put on and taking off regular lower body clothing?: A Little 6 Click Score: 22   End of Session Equipment Utilized During Treatment: Gait belt;Rolling walker (2 wheels) Nurse Communication: Mobility status  Activity Tolerance: Patient tolerated treatment well Patient left: in bed;with call bell/phone within reach  OT Visit Diagnosis: Unsteadiness on feet (R26.81);Other abnormalities of gait and mobility (R26.89);Muscle weakness (generalized) (M62.81);Pain Pain - Right/Left: Left Pain - part of body: Knee                Time: 6644-0347 OT Time Calculation (min): 15 min Charges:  OT General Charges $OT Visit: 1 Visit OT Evaluation $OT Eval Low Complexity: 1 Low  Garnell Phenix  Theodora Blow 09/13/2023, 9:42 AM

## 2023-09-15 ENCOUNTER — Other Ambulatory Visit: Payer: Self-pay

## 2023-09-15 ENCOUNTER — Encounter (HOSPITAL_COMMUNITY): Payer: Self-pay | Admitting: Orthopaedic Surgery

## 2023-09-15 DIAGNOSIS — Z471 Aftercare following joint replacement surgery: Secondary | ICD-10-CM | POA: Diagnosis not present

## 2023-09-15 DIAGNOSIS — E559 Vitamin D deficiency, unspecified: Secondary | ICD-10-CM | POA: Diagnosis not present

## 2023-09-15 DIAGNOSIS — M5412 Radiculopathy, cervical region: Secondary | ICD-10-CM | POA: Diagnosis not present

## 2023-09-15 DIAGNOSIS — E119 Type 2 diabetes mellitus without complications: Secondary | ICD-10-CM | POA: Diagnosis not present

## 2023-09-15 DIAGNOSIS — I10 Essential (primary) hypertension: Secondary | ICD-10-CM | POA: Diagnosis not present

## 2023-09-15 DIAGNOSIS — E785 Hyperlipidemia, unspecified: Secondary | ICD-10-CM | POA: Diagnosis not present

## 2023-09-15 DIAGNOSIS — M5430 Sciatica, unspecified side: Secondary | ICD-10-CM | POA: Diagnosis not present

## 2023-09-15 DIAGNOSIS — Z7901 Long term (current) use of anticoagulants: Secondary | ICD-10-CM | POA: Diagnosis not present

## 2023-09-15 DIAGNOSIS — N529 Male erectile dysfunction, unspecified: Secondary | ICD-10-CM | POA: Diagnosis not present

## 2023-09-15 DIAGNOSIS — G5600 Carpal tunnel syndrome, unspecified upper limb: Secondary | ICD-10-CM | POA: Diagnosis not present

## 2023-09-16 ENCOUNTER — Telehealth: Payer: Self-pay | Admitting: Orthopaedic Surgery

## 2023-09-16 NOTE — Telephone Encounter (Signed)
 Jatana (PT) from [facility] called requesting verbal orders for 3wk 1, and 2 wk 1. Jatana secure number is [phone].

## 2023-09-16 NOTE — Telephone Encounter (Signed)
 Called and notified Clearence Cheek that Dr.Xu agrees with plan of care.

## 2023-09-17 DIAGNOSIS — N529 Male erectile dysfunction, unspecified: Secondary | ICD-10-CM | POA: Diagnosis not present

## 2023-09-17 DIAGNOSIS — E785 Hyperlipidemia, unspecified: Secondary | ICD-10-CM | POA: Diagnosis not present

## 2023-09-17 DIAGNOSIS — G5600 Carpal tunnel syndrome, unspecified upper limb: Secondary | ICD-10-CM | POA: Diagnosis not present

## 2023-09-17 DIAGNOSIS — M5412 Radiculopathy, cervical region: Secondary | ICD-10-CM | POA: Diagnosis not present

## 2023-09-17 DIAGNOSIS — E119 Type 2 diabetes mellitus without complications: Secondary | ICD-10-CM | POA: Diagnosis not present

## 2023-09-17 DIAGNOSIS — Z471 Aftercare following joint replacement surgery: Secondary | ICD-10-CM | POA: Diagnosis not present

## 2023-09-17 DIAGNOSIS — E559 Vitamin D deficiency, unspecified: Secondary | ICD-10-CM | POA: Diagnosis not present

## 2023-09-17 DIAGNOSIS — Z7901 Long term (current) use of anticoagulants: Secondary | ICD-10-CM | POA: Diagnosis not present

## 2023-09-17 DIAGNOSIS — I10 Essential (primary) hypertension: Secondary | ICD-10-CM | POA: Diagnosis not present

## 2023-09-17 DIAGNOSIS — M5430 Sciatica, unspecified side: Secondary | ICD-10-CM | POA: Diagnosis not present

## 2023-09-18 ENCOUNTER — Other Ambulatory Visit: Payer: Self-pay

## 2023-09-22 DIAGNOSIS — E785 Hyperlipidemia, unspecified: Secondary | ICD-10-CM | POA: Diagnosis not present

## 2023-09-22 DIAGNOSIS — M5412 Radiculopathy, cervical region: Secondary | ICD-10-CM | POA: Diagnosis not present

## 2023-09-22 DIAGNOSIS — E559 Vitamin D deficiency, unspecified: Secondary | ICD-10-CM | POA: Diagnosis not present

## 2023-09-22 DIAGNOSIS — M5430 Sciatica, unspecified side: Secondary | ICD-10-CM | POA: Diagnosis not present

## 2023-09-22 DIAGNOSIS — Z7901 Long term (current) use of anticoagulants: Secondary | ICD-10-CM | POA: Diagnosis not present

## 2023-09-22 DIAGNOSIS — N529 Male erectile dysfunction, unspecified: Secondary | ICD-10-CM | POA: Diagnosis not present

## 2023-09-22 DIAGNOSIS — E119 Type 2 diabetes mellitus without complications: Secondary | ICD-10-CM | POA: Diagnosis not present

## 2023-09-22 DIAGNOSIS — I10 Essential (primary) hypertension: Secondary | ICD-10-CM | POA: Diagnosis not present

## 2023-09-22 DIAGNOSIS — G5600 Carpal tunnel syndrome, unspecified upper limb: Secondary | ICD-10-CM | POA: Diagnosis not present

## 2023-09-22 DIAGNOSIS — Z471 Aftercare following joint replacement surgery: Secondary | ICD-10-CM | POA: Diagnosis not present

## 2023-09-24 DIAGNOSIS — M5412 Radiculopathy, cervical region: Secondary | ICD-10-CM | POA: Diagnosis not present

## 2023-09-24 DIAGNOSIS — M5430 Sciatica, unspecified side: Secondary | ICD-10-CM | POA: Diagnosis not present

## 2023-09-24 DIAGNOSIS — E559 Vitamin D deficiency, unspecified: Secondary | ICD-10-CM | POA: Diagnosis not present

## 2023-09-24 DIAGNOSIS — E785 Hyperlipidemia, unspecified: Secondary | ICD-10-CM | POA: Diagnosis not present

## 2023-09-24 DIAGNOSIS — E119 Type 2 diabetes mellitus without complications: Secondary | ICD-10-CM | POA: Diagnosis not present

## 2023-09-24 DIAGNOSIS — N529 Male erectile dysfunction, unspecified: Secondary | ICD-10-CM | POA: Diagnosis not present

## 2023-09-24 DIAGNOSIS — Z7901 Long term (current) use of anticoagulants: Secondary | ICD-10-CM | POA: Diagnosis not present

## 2023-09-24 DIAGNOSIS — G5600 Carpal tunnel syndrome, unspecified upper limb: Secondary | ICD-10-CM | POA: Diagnosis not present

## 2023-09-24 DIAGNOSIS — I10 Essential (primary) hypertension: Secondary | ICD-10-CM | POA: Diagnosis not present

## 2023-09-24 DIAGNOSIS — Z471 Aftercare following joint replacement surgery: Secondary | ICD-10-CM | POA: Diagnosis not present

## 2023-09-25 ENCOUNTER — Other Ambulatory Visit: Payer: Self-pay | Admitting: Internal Medicine

## 2023-09-25 ENCOUNTER — Other Ambulatory Visit (HOSPITAL_COMMUNITY): Payer: Self-pay

## 2023-09-25 ENCOUNTER — Ambulatory Visit: Payer: Commercial Managed Care - PPO | Admitting: Physician Assistant

## 2023-09-25 DIAGNOSIS — Z96652 Presence of left artificial knee joint: Secondary | ICD-10-CM

## 2023-09-25 MED ORDER — TADALAFIL 20 MG PO TABS
ORAL_TABLET | ORAL | 2 refills | Status: DC
  Filled 2023-09-25 – 2023-09-26 (×2): qty 30, 30d supply, fill #0
  Filled 2023-10-31: qty 2, 10d supply, fill #1
  Filled 2023-11-01: qty 6, 30d supply, fill #1
  Filled 2023-11-12 (×2): qty 6, 30d supply, fill #2
  Filled 2023-11-19: qty 30, 30d supply, fill #3
  Filled 2023-12-20 – 2023-12-22 (×2): qty 18, 9d supply, fill #4

## 2023-09-25 MED ORDER — METHOCARBAMOL 750 MG PO TABS
750.0000 mg | ORAL_TABLET | Freq: Three times a day (TID) | ORAL | 2 refills | Status: DC | PRN
  Filled 2023-09-25 (×2): qty 30, 10d supply, fill #0
  Filled 2023-10-09: qty 30, 10d supply, fill #1

## 2023-09-25 MED ORDER — OXYCODONE-ACETAMINOPHEN 5-325 MG PO TABS
1.0000 | ORAL_TABLET | Freq: Three times a day (TID) | ORAL | 0 refills | Status: DC | PRN
  Filled 2023-09-25 (×2): qty 40, 7d supply, fill #0

## 2023-09-25 NOTE — Progress Notes (Signed)
 Post-Op Visit Note   Patient: Dale Bishop           Date of Birth: 12-25-1960           MRN: [id number] Visit Date: 09/25/2023 PCP: Tresa Garter, MD   Assessment & Plan:  Chief Complaint:  Chief Complaint  Patient presents with   Left Knee - Follow-up    Left total knee arthroplasty 09/12/2023   Visit Diagnoses:  1. Status post total left knee replacement     Plan: Patient is a pleasant 63 year old gentleman who comes in today 2 weeks status post left total knee replacement 09/12/2023.  He has been getting physical therapy and is ambulating with a walker.  He has been compliant taking his Eliquis twice daily for DVT prophylaxis.  Examination of his left knee reveals a well-healed surgical incision.  No evidence of infection or cellulitis.  Calf soft nontender.  He is neurovascularly intact distally.  Today, his wound was cleaned and recovered with Steri-Strips.  I have refilled his Percocet and Robaxin.  I have sent in an order for outpatient physical therapy.  He will continue with the Eliquis for 2 more weeks and then transition to a baby aspirin twice daily for 2 weeks.  Follow-up in 4 weeks for repeat evaluation and 2 view x-rays of left knee.  Call with concerns or questions.  Follow-Up Instructions: Return in about 4 weeks (around 10/23/2023).   Orders:  Orders Placed This Encounter  Procedures   Ambulatory referral to Physical Therapy   Meds ordered this encounter  Medications   oxyCODONE-acetaminophen (PERCOCET) 5-325 MG tablet    Sig: Take 1-2 tablets by mouth every 8 (eight) hours as needed. To be taken after surgery    Dispense:  40 tablet    Refill:  0   methocarbamol (ROBAXIN-750) 750 MG tablet    Sig: Take 1 tablet (750 mg total) by mouth every 8 (eight) hours as needed for muscle spasms. To be taken after surgery    Dispense:  30 tablet    Refill:  2    Imaging: No new imaging  PMFS History: Patient Active Problem List   Diagnosis Date Noted    Status post total left knee replacement 09/12/2023   Low serum vitamin B12 07/02/2023   Tremor, unspecified 02/10/2022   Cervical radiculopathy 01/24/2020   Sebaceous cyst 01/24/2020   Sciatica 01/24/2020   Erectile dysfunction 08/18/2019   Gross hematuria 06/08/2019   Need for pneumococcal vaccination 06/08/2019   Need for Tdap vaccination 06/08/2019   Acute prostatitis 06/08/2019   Primary osteoarthritis of left knee 08/05/2016   Type II diabetes mellitus with manifestations (HCC) 08/05/2016   Vitamin D deficiency 08/05/2016   CTS (carpal tunnel syndrome) 01/08/2015   Well adult exam 08/01/2014   Allergic rhinitis 04/21/2014   Hyperlipidemia with target LDL less than 100 10/22/2013   Essential hypertension 09/01/2013   Eczema 09/01/2013   Past Medical History:  Diagnosis Date   Diabetes mellitus without complication (HCC)    Hyperlipidemia    Hypertension     Family History  Problem Relation Age of Onset   Colon cancer Neg Hx     Past Surgical History:  Procedure Laterality Date   COLONOSCOPY     NO PAST SURGERIES     TOTAL KNEE ARTHROPLASTY Left 09/12/2023   Procedure: LEFT TOTAL KNEE ARTHROPLASTY;  Surgeon: Tarry Kos, MD;  Location: [facility] OR;  Service: Orthopedics;  Laterality: Left;   Social History  Occupational History   Not on file  Tobacco Use   Smoking status: Never   Smokeless tobacco: Never  Vaping Use   Vaping status: Never Used  Substance and Sexual Activity   Alcohol use: No    Alcohol/week: 0.0 standard drinks of alcohol   Drug use: No   Sexual activity: Yes

## 2023-09-26 ENCOUNTER — Other Ambulatory Visit (HOSPITAL_COMMUNITY): Payer: Self-pay

## 2023-09-29 ENCOUNTER — Encounter: Payer: Self-pay | Admitting: Pharmacist

## 2023-09-29 ENCOUNTER — Other Ambulatory Visit (HOSPITAL_COMMUNITY): Payer: Self-pay

## 2023-09-29 ENCOUNTER — Other Ambulatory Visit: Payer: Self-pay

## 2023-10-01 ENCOUNTER — Ambulatory Visit: Admitting: Rehabilitative and Restorative Service Providers"

## 2023-10-02 ENCOUNTER — Ambulatory Visit: Admitting: Rehabilitative and Restorative Service Providers"

## 2023-10-06 ENCOUNTER — Other Ambulatory Visit (HOSPITAL_COMMUNITY): Payer: Self-pay

## 2023-10-07 ENCOUNTER — Ambulatory Visit (INDEPENDENT_AMBULATORY_CARE_PROVIDER_SITE_OTHER): Admitting: Physical Therapy

## 2023-10-07 ENCOUNTER — Encounter: Payer: Self-pay | Admitting: Physical Therapy

## 2023-10-07 DIAGNOSIS — M6281 Muscle weakness (generalized): Secondary | ICD-10-CM | POA: Diagnosis not present

## 2023-10-07 DIAGNOSIS — R262 Difficulty in walking, not elsewhere classified: Secondary | ICD-10-CM

## 2023-10-07 DIAGNOSIS — M25562 Pain in left knee: Secondary | ICD-10-CM

## 2023-10-07 DIAGNOSIS — R600 Localized edema: Secondary | ICD-10-CM

## 2023-10-07 NOTE — Therapy (Signed)
 OUTPATIENT PHYSICAL THERAPY LOWER EXTREMITY EVALUATION   Patient Name: Maksym Pfiffner MRN: [id number] DOB:12-Mar-1961, 63 y.o., male Today's Date: 10/07/2023  END OF SESSION:  PT End of Session - 10/07/23 1239     Visit Number 1    Number of Visits 20    Date for PT Re-Evaluation 12/16/23    PT Start Time 1110    PT Stop Time 1148    PT Time Calculation (min) 38 min    Activity Tolerance Patient tolerated treatment well    Behavior During Therapy WFL for tasks assessed/performed             Past Medical History:  Diagnosis Date   Diabetes mellitus without complication (HCC)    Hyperlipidemia    Hypertension    Past Surgical History:  Procedure Laterality Date   COLONOSCOPY     NO PAST SURGERIES     TOTAL KNEE ARTHROPLASTY Left 09/12/2023   Procedure: LEFT TOTAL KNEE ARTHROPLASTY;  Surgeon: Wes Hamman, MD;  Location: [facility] OR;  Service: Orthopedics;  Laterality: Left;   Patient Active Problem List   Diagnosis Date Noted   Status post total left knee replacement 09/12/2023   Low serum vitamin B12 07/02/2023   Tremor, unspecified 02/10/2022   Cervical radiculopathy 01/24/2020   Sebaceous cyst 01/24/2020   Sciatica 01/24/2020   Erectile dysfunction 08/18/2019   Gross hematuria 06/08/2019   Need for pneumococcal vaccination 06/08/2019   Need for Tdap vaccination 06/08/2019   Acute prostatitis 06/08/2019   Primary osteoarthritis of left knee 08/05/2016   Type II diabetes mellitus with manifestations (HCC) 08/05/2016   Vitamin D  deficiency 08/05/2016   CTS (carpal tunnel syndrome) 01/08/2015   Well adult exam 08/01/2014   Allergic rhinitis 04/21/2014   Hyperlipidemia with target LDL less than 100 10/22/2013   Essential hypertension 09/01/2013   Eczema 09/01/2013    PCP: Genia Kettering, MD   REFERRING PROVIDER: Sandie Cross, PA-C   REFERRING DIAG:  Diagnosis  [phone] (ICD-10-CM) - Status post total left knee replacement    THERAPY DIAG:  Acute  pain of left knee  Difficulty in walking, not elsewhere classified  Localized edema  Muscle weakness (generalized)  Rationale for Evaluation and Treatment: Rehabilitation  ONSET DATE: 09/12/23 TKA surgery left  SUBJECTIVE:     SUBJECTIVE STATEMENT: Pt status post left total knee replacement 09/12/2023. Pt received HHPT.   PERTINENT HISTORY: See PMH above Left TKA on 09/12/23   PAIN:  NPRS scale: 4/10 Pain location: left knee Pain description: achy, soreness Aggravating factors: walking Relieving factors: CPM, elevated  PRECAUTIONS: None  WEIGHT BEARING RESTRICTIONS: No  FALLS:  Has patient fallen in last 6 months? No  LIVING ENVIRONMENT: Lives with: lives with their family and lives with their spouse Lives in: House/apartment Stairs: Yes: Internal: 15 steps; on left going up and and right on 2nd stairs after  landing Has following equipment at home: Environmental consultant - 2 wheeled  OCCUPATION: finance at [employer]  PLOF: Independent  PATIENT GOALS: get in car without lifting leg, walk correctly and be comfortable and get back to gym  Next MD visit:   OBJECTIVE:   DIAGNOSTIC FINDINGS: Left knee arthroplasty without immediate postoperative complication   PATIENT SURVEYS:  Patient-Specific Activity Scoring Scheme  "0" represents "unable to perform." "10" represents "able to perform at prior level. 0 1 2 3 4 5 6 7 8 9  10 (Date and Score)   Activity Eval  10/07/23    1. walking 4  2. sleeping 5     3. Standing long periods 5   4.bending knee 5   5.    Score 4.75    Total score = sum of the activity scores/number of activities Minimum detectable change (90%CI) for average score = 2 points Minimum detectable change (90%CI) for single activity score = 3 points  COGNITION: Overall cognitive status: WFL   EDEMA:  Circumferential: Rt: 42 centimeters, left 48 centimeters    LOWER EXTREMITY ROM:   ROM Right 10/07/23 supine Left 10/07/23 supine  Hip flexion     Hip extension    Hip abduction    Hip adduction    Hip internal rotation    Hip external rotation    Knee flexion    Knee extension 0 -7  Ankle dorsiflexion    Ankle plantarflexion    Ankle inversion    Ankle eversion     (Blank rows = not tested)  LOWER EXTREMITY MMT:  MMT Right 10/07/23 Left 10/07/23 Sitting HHD  Hip flexion    Hip extension    Hip abduction    Hip adduction    Hip internal rotation    Hip external rotation    Knee flexion 59.0 ppsi 21.8 ppsi  Knee extension 61.9 ppsi 35.2 ppsi  Ankle dorsiflexion    Ankle plantarflexion    Ankle inversion    Ankle eversion     (Blank rows = not tested)    FUNCTIONAL TESTS:  10/07/23: 5 time sit to stand: 23.69 c UE support  GAIT: Distance walked: clinic distance Assistive device utilized: Walker - 2 wheeled Level of assistance: Modified independence Comments: antalgic gait                                                                                                                                                                        TODAY'S TREATMENT                                                                          DATE: 10/07/23 Therex:    HEP instruction/performance c cues for techniques, handout provided.  Trial set performed of each for comprehension and symptom assessment.  See below for exercise list Self Care:  Ice/ elevation for swelling and pain, CPM progression, applied tennis balls to pt's RW for easier navigation with his walker and encouraged heel to toe gait pattern  PATIENT EDUCATION:  Education details: HEP, POC Person educated: Patient Education method: Explanation,  Demonstration, Verbal cues, and Handouts Education comprehension: verbalized understanding, returned demonstration, and verbal cues required  HOME EXERCISE PROGRAM: Access Code: [email or web address] URL: [email or web address] Date: 10/07/2023 Prepared by: Jerrel Mor  Exercises - Long Sitting Quad Set  with Towel Roll Under Heel  - 3 x daily - 7 x weekly - 2 sets - 10 reps - 5-10 seconds hold - Supine Heel Slide  - 3 x daily - 7 x weekly - 2 sets - 10 reps - 5 seconds hold - Sit to Stand with Counter Support  - 3 x daily - 7 x weekly - 2 sets - 10 reps - Seated Hamstring Curl with Anchored Resistance  - 3 x daily - 7 x weekly - 2 sets - 10 reps - 3 seconds hold - Seated Long Arc Quad  - 3 x daily - 7 x weekly - 2 sets - 10 reps - 3 seconds hold  ASSESSMENT:  CLINICAL IMPRESSION: Patient is a 63 y.o. who comes to clinic with complaints of left knee pain s/p left TKA on 09/12/23. Pt presents with mobility, strength and movement coordination deficits that impair their ability to perform usual daily and recreational functional activities without increase difficulty/symptoms at this time.  Patient to benefit from skilled PT services to address impairments and limitations to improve to previous level of function without restriction secondary to condition.   OBJECTIVE IMPAIRMENTS: decreased activity tolerance, decreased balance, decreased mobility, difficulty walking, decreased ROM, decreased strength, impaired flexibility, and pain.   ACTIVITY LIMITATIONS: lifting, bending, standing, squatting, sleeping, stairs, and transfers  PARTICIPATION LIMITATIONS: driving, community activity, and occupation  PERSONAL FACTORS: 3+ comorbidities: see PMH  are also affecting patient's functional outcome.   REHAB POTENTIAL: Good  CLINICAL DECISION MAKING: Stable/uncomplicated  EVALUATION COMPLEXITY: Low   GOALS: Goals reviewed with patient? Yes  SHORT TERM GOALS: (target date for Short term goals are 3 weeks 10/28/2023)   1.  Patient will demonstrate independent use of home exercise program to maintain progress from in clinic treatments.  Goal status: New  LONG TERM GOALS: (target dates for all long term goals are 10 weeks  12/16/2023)   1. Patient will demonstrate/report pain at worst less than or  equal to 2/10 to facilitate minimal limitation in daily activity secondary to pain symptoms.  Goal status: New   2. Patient will demonstrate independent use of home exercise program to facilitate ability to maintain/progress functional gains from skilled physical therapy services.  Goal status: New   3. Patient will demonstrate Patient specific functional scale avg > or = 6.75 to indicate reduced disability due to condition.   Goal status: New   4.  Patient will demonstrate left LE MMT >/= 4/5 throughout to faciltiate usual transfers, stairs, squatting at [facility] for daily life.   Goal status: New   5.  Patient will demonstrate left knee ROM of 2-115 degrees for improved functional mobility.  Goal status: New   6.  Pt will be able to navigate 1 flight of stairs with reciprocal gait pattern with single hand rail.  Goal status: New      PLAN:  PT FREQUENCY: 1-2x/week  PT DURATION: 10 weeks  PLANNED INTERVENTIONS: Can include 16109- PT Re-evaluation, 97110-Therapeutic exercises, 97530- Therapeutic activity, [id number]- Neuromuscular re-education, 97535- Self Care, 97140- Manual therapy, [id number]- Gait training, [id number]- Orthotic Fit/training, [phone]- Canalith repositioning, [id number]- Aquatic Therapy, B1478- Electrical stimulation (unattended), [id number]- Electrical stimulation (manual), [id number] Physical performance testing, 97016- Vasopneumatic device, [id number]- Ultrasound, [id number]- Traction (mechanical), [id number]- Ionotophoresis 4mg /ml Dexamethasone ,  Patient/Family education, Balance training, Stair training, Taping, Dry Needling, Joint mobilization, Joint manipulation, Spinal manipulation, Spinal mobilization, Scar mobilization, Vestibular training, Visual/preceptual remediation/compensation, DME instructions, Cryotherapy, and Moist heat.  All performed as medically necessary.  All included unless contraindicated  PLAN FOR NEXT SESSION: Review HEP knowledge/results, quad strengthening, vaso, ROM, functional  mobility progression    Marysue Sola, PT, MPT 10/07/2023, 12:41 PM

## 2023-10-08 ENCOUNTER — Encounter: Admitting: Rehabilitative and Restorative Service Providers"

## 2023-10-09 ENCOUNTER — Other Ambulatory Visit: Payer: Self-pay

## 2023-10-09 ENCOUNTER — Other Ambulatory Visit (HOSPITAL_COMMUNITY): Payer: Self-pay

## 2023-10-09 ENCOUNTER — Other Ambulatory Visit: Payer: Self-pay | Admitting: Physician Assistant

## 2023-10-10 ENCOUNTER — Other Ambulatory Visit: Payer: Self-pay

## 2023-10-10 ENCOUNTER — Other Ambulatory Visit: Payer: Self-pay | Admitting: Physician Assistant

## 2023-10-10 ENCOUNTER — Other Ambulatory Visit (HOSPITAL_COMMUNITY): Payer: Self-pay

## 2023-10-10 MED ORDER — OXYCODONE-ACETAMINOPHEN 5-325 MG PO TABS
1.0000 | ORAL_TABLET | Freq: Three times a day (TID) | ORAL | 0 refills | Status: DC | PRN
  Filled 2023-10-10: qty 40, 7d supply, fill #0

## 2023-10-10 NOTE — Telephone Encounter (Signed)
 Should only need the doxy rx and eliquis  rx we sent in as long as he was able to pick up the number that was prescribed?  I will refill the oxy.

## 2023-10-11 ENCOUNTER — Other Ambulatory Visit (HOSPITAL_COMMUNITY): Payer: Self-pay

## 2023-10-13 ENCOUNTER — Other Ambulatory Visit (HOSPITAL_COMMUNITY): Payer: Self-pay

## 2023-10-13 ENCOUNTER — Other Ambulatory Visit: Payer: Self-pay | Admitting: Internal Medicine

## 2023-10-13 MED ORDER — CARVEDILOL 12.5 MG PO TABS
12.5000 mg | ORAL_TABLET | Freq: Two times a day (BID) | ORAL | 3 refills | Status: DC
  Filled 2023-10-13: qty 180, 90d supply, fill #0

## 2023-10-20 ENCOUNTER — Encounter: Admitting: Rehabilitative and Restorative Service Providers"

## 2023-10-22 ENCOUNTER — Encounter: Payer: Self-pay | Admitting: Rehabilitative and Restorative Service Providers"

## 2023-10-22 ENCOUNTER — Ambulatory Visit (INDEPENDENT_AMBULATORY_CARE_PROVIDER_SITE_OTHER): Payer: Self-pay

## 2023-10-22 ENCOUNTER — Ambulatory Visit (INDEPENDENT_AMBULATORY_CARE_PROVIDER_SITE_OTHER): Admitting: Physician Assistant

## 2023-10-22 ENCOUNTER — Encounter: Payer: Self-pay | Admitting: Physician Assistant

## 2023-10-22 ENCOUNTER — Ambulatory Visit (INDEPENDENT_AMBULATORY_CARE_PROVIDER_SITE_OTHER): Admitting: Rehabilitative and Restorative Service Providers"

## 2023-10-22 DIAGNOSIS — M6281 Muscle weakness (generalized): Secondary | ICD-10-CM | POA: Diagnosis not present

## 2023-10-22 DIAGNOSIS — Z96652 Presence of left artificial knee joint: Secondary | ICD-10-CM | POA: Diagnosis not present

## 2023-10-22 DIAGNOSIS — R600 Localized edema: Secondary | ICD-10-CM

## 2023-10-22 DIAGNOSIS — M25562 Pain in left knee: Secondary | ICD-10-CM

## 2023-10-22 DIAGNOSIS — R262 Difficulty in walking, not elsewhere classified: Secondary | ICD-10-CM

## 2023-10-22 NOTE — Progress Notes (Signed)
   Post-Op Visit Note   Patient: Dale Bishop           Date of Birth: [birth year]           MRN: [id number] Visit Date: 10/22/2023 PCP: Genia Kettering, MD   Assessment & Plan:  Chief Complaint:  Chief Complaint  Patient presents with   Left Knee - Follow-up    Left total knee arthroplasty 09/12/2023   Visit Diagnoses:  1. Status post total left knee replacement     Plan: Patient is a pleasant 63 year old gentleman who comes in today 6 weeks status post left total knee replacement 09/12/2023.  He has been doing well.  He takes occasional pain pill at night.  He has recently started outpatient physical therapy.  Walking with a walker at times.  He tells me he is still taking Eliquis .  Examination of the left knee reveals a fully healed surgical scar without complication.  Range of motion 5 to 95 degrees.  He is neurovascularly intact distally.  At this point, I do not recommend taking DVT prophylaxis any longer.  He currently does not need any refills on his narcotic pain medication.  He will continue with physical therapy.  Follow-up with us  in 6 weeks for recheck.  Call with concerns or questions.  Follow-Up Instructions: Return in about 6 weeks (around 12/03/2023).   Orders:  Orders Placed This Encounter  Procedures   XR Knee 1-2 Views Left   No orders of the defined types were placed in this encounter.   Imaging: No new imaging  PMFS History: Patient Active Problem List   Diagnosis Date Noted   Status post total left knee replacement 09/12/2023   Low serum vitamin B12 07/02/2023   Tremor, unspecified 02/10/2022   Cervical radiculopathy 01/24/2020   Sebaceous cyst 01/24/2020   Sciatica 01/24/2020   Erectile dysfunction 08/18/2019   Gross hematuria 06/08/2019   Need for pneumococcal vaccination 06/08/2019   Need for Tdap vaccination 06/08/2019   Acute prostatitis 06/08/2019   Primary osteoarthritis of left knee 08/05/2016   Type II diabetes mellitus with  manifestations (HCC) 08/05/2016   Vitamin D  deficiency 08/05/2016   CTS (carpal tunnel syndrome) 01/08/2015   Well adult exam 08/01/2014   Allergic rhinitis 04/21/2014   Hyperlipidemia with target LDL less than 100 10/22/2013   Essential hypertension 09/01/2013   Eczema 09/01/2013   Past Medical History:  Diagnosis Date   Diabetes mellitus without complication (HCC)    Hyperlipidemia    Hypertension     Family History  Problem Relation Age of Onset   Colon cancer Neg Hx     Past Surgical History:  Procedure Laterality Date   COLONOSCOPY     NO PAST SURGERIES     TOTAL KNEE ARTHROPLASTY Left 09/12/2023   Procedure: LEFT TOTAL KNEE ARTHROPLASTY;  Surgeon: Wes Hamman, MD;  Location: [facility] OR;  Service: Orthopedics;  Laterality: Left;   Social History   Occupational History   Not on file  Tobacco Use   Smoking status: Never   Smokeless tobacco: Never  Vaping Use   Vaping status: Never Used  Substance and Sexual Activity   Alcohol use: No    Alcohol/week: 0.0 standard drinks of alcohol   Drug use: No   Sexual activity: Yes

## 2023-10-22 NOTE — Therapy (Signed)
 OUTPATIENT PHYSICAL THERAPY TREATMENT   Patient Name: Dale Bishop MRN: [id number] DOB:[birth year], 63 y.o., male Today's Date: 10/22/2023  END OF SESSION:  PT End of Session - 10/22/23 1109     Visit Number 2    Number of Visits 20    Date for PT Re-Evaluation 12/16/23    Authorization Type [facility] [employer]ET[employer]    PT Start Time 1104    PT Stop Time 1144    PT Time Calculation (min) 40 min    Activity Tolerance Patient tolerated treatment well    Behavior During Therapy WFL for tasks assessed/performed              Past Medical History:  Diagnosis Date   Diabetes mellitus without complication (HCC)    Hyperlipidemia    Hypertension    Past Surgical History:  Procedure Laterality Date   COLONOSCOPY     NO PAST SURGERIES     TOTAL KNEE ARTHROPLASTY Left 09/12/2023   Procedure: LEFT TOTAL KNEE ARTHROPLASTY;  Surgeon: Wes Hamman, MD;  Location: [facility] OR;  Service: Orthopedics;  Laterality: Left;   Patient Active Problem List   Diagnosis Date Noted   Status post total left knee replacement 09/12/2023   Low serum vitamin B12 07/02/2023   Tremor, unspecified 02/10/2022   Cervical radiculopathy 01/24/2020   Sebaceous cyst 01/24/2020   Sciatica 01/24/2020   Erectile dysfunction 08/18/2019   Gross hematuria 06/08/2019   Need for pneumococcal vaccination 06/08/2019   Need for Tdap vaccination 06/08/2019   Acute prostatitis 06/08/2019   Primary osteoarthritis of left knee 08/05/2016   Type II diabetes mellitus with manifestations (HCC) 08/05/2016   Vitamin D  deficiency 08/05/2016   CTS (carpal tunnel syndrome) 01/08/2015   Well adult exam 08/01/2014   Allergic rhinitis 04/21/2014   Hyperlipidemia with target LDL less than 100 10/22/2013   Essential hypertension 09/01/2013   Eczema 09/01/2013    PCP: Genia Kettering, MD   REFERRING PROVIDER: Sandie Cross, PA-C   REFERRING DIAG:  Diagnosis  [phone] (ICD-10-CM) - Status post total left knee replacement     THERAPY DIAG:  Acute pain of left knee  Difficulty in walking, not elsewhere classified  Localized edema  Muscle weakness (generalized)  Rationale for Evaluation and Treatment: Rehabilitation  ONSET DATE: 09/12/23 TKA surgery left  SUBJECTIVE:   SUBJECTIVE STATEMENT: Pt indicated having uncomfortable at most.  Pt indicated having most pian when bending knee.  Pt indicated not using walker much at home.  Reported not having a cane.  Pt indicated sleeping was better.   Pt indicated return to work May 19th.    PERTINENT HISTORY: See PMH above Left TKA on 09/12/23  PAIN:  NPRS scale: at worst in last few days:  5/10 Pain location: left knee Pain description: achy, soreness Aggravating factors: walking Relieving factors: CPM, elevated  PRECAUTIONS: None  WEIGHT BEARING RESTRICTIONS: No  FALLS:  Has patient fallen in last 6 months? No  LIVING ENVIRONMENT: Lives with: lives with their family and lives with their spouse Lives in: House/apartment Stairs: Yes: Internal: 15 steps; on left going up and and right on 2nd stairs after  landing Has following equipment at home: Otho Blitz - 2 wheeled  OCCUPATION: finance at [employer]  PLOF: Independent  PATIENT GOALS: get in car without lifting leg, walk correctly and be comfortable and get back to gym  Next MD visit:   OBJECTIVE:   DIAGNOSTIC FINDINGS: Left knee arthroplasty without immediate postoperative complication   PATIENT SURVEYS:  Patient-Specific Activity Scoring Scheme  "0" represents "unable to perform." "10" represents "able to perform at prior level. 0 1 2 3 4 5 6 7 8 9  10 (Date and Score)   Activity Eval  10/07/23    1. walking 4     2. sleeping 5     3. Standing long periods 5   4.bending knee 5   5.    Score 4.75    Total score = sum of the activity scores/number of activities Minimum detectable change (90%CI) for average score = 2 points Minimum detectable change (90%CI) for single activity score  = 3 points  COGNITION: 10/07/23 Overall cognitive status: [facility]   EDEMA:  10/07/23 Circumferential: Rt: 42 centimeters, left 48 centimeters    LOWER EXTREMITY ROM:   ROM Right 10/07/23 supine Left 10/07/23 supine Left 10/22/2023  Hip flexion     Hip extension     Hip abduction     Hip adduction     Hip internal rotation     Hip external rotation     Knee flexion   93 AROM in supine heel slide   Knee extension 0 -7 -10 AROM in seated LAQ  Ankle dorsiflexion     Ankle plantarflexion     Ankle inversion     Ankle eversion      (Blank rows = not tested)  LOWER EXTREMITY MMT:  MMT Right 10/07/23 Left 10/07/23 Sitting HHD Left 10/22/2023  Hip flexion     Hip extension     Hip abduction     Hip adduction     Hip internal rotation     Hip external rotation     Knee flexion 59.0 ppsi 21.8 ppsi   Knee extension 61.9 ppsi 35.2 ppsi 5/5 41.1, 37.7 lb  Ankle dorsiflexion     Ankle plantarflexion     Ankle inversion     Ankle eversion      (Blank rows = not tested)    FUNCTIONAL TESTS:  10/07/23: 5 time sit to stand: 23.69 c UE support  GAIT: 10/22/2023   10/07/23 Distance walked: clinic distance Assistive device utilized: Environmental consultant - 2 wheeled Level of assistance: Modified independence Comments: antalgic gait                                                                                                                                                                        TODAY'S TREATMENT  DATE: 10/22/2023 Therex: Nustep lvl 7 8 mins UE/LE  Review of existing HEP c cues for techniques and procedures. Education on importance of routine use during day. Supine heel prop 90 seconds to tolerance Supine heel slide 5 sec hold x2 Seated Lt leg LAQ x 10 with end range pauses Seated Lt leg AAROM with Rt leg overpressure 15 sec x 5 Seated quad set 5 sec hold x 10 Lt  Seated quad set with SLR x 10 Lt      TherActivity Sit to stand to sit 18 inch chair s UE assist x 5 (cues for home use with focus on lowering slowly) Leg press double leg 62 lbs x 15, Lt leg only x 15 25 lbs      TODAY'S TREATMENT                                                                          DATE:10/07/23 Therex:    HEP instruction/performance c cues for techniques, handout provided.  Trial set performed of each for comprehension and symptom assessment.  See below for exercise list Self Care:  Ice/ elevation for swelling and pain, CPM progression, applied tennis balls to pt's RW for easier navigation with his walker and encouraged heel to toe gait pattern  PATIENT EDUCATION:  HEP  update Education details: HEP Person educated: Patient Education method: Programmer, multimedia, Demonstration, Verbal cues, and Handouts Education comprehension: verbalized understanding, returned demonstration, and verbal cues required  HOME EXERCISE PROGRAM: Access Code: [email or web address] URL: [email or web address] Date: 10/22/2023 Prepared by: Bonna Bustard  Exercises - Supine Heel Slide (Mirrored)  - 1-2 x daily - 7 x weekly - 2 sets - 10 reps - 5 seconds hold - Seated Long Arc Quad (Mirrored)  - 3 x daily - 7 x weekly - 2 sets - 10 reps - 3 seconds hold - Seated Quad Set (Mirrored)  - 3-5 x daily - 7 x weekly - 1 sets - 10 reps - 5 hold - Seated Straight Leg Heel Taps  - 1-2 x daily - 7 x weekly - 2-3 sets - 10 reps - Seated Knee Flexion Extension AAROM with Overpressure (Mirrored)  - 2-3 x daily - 7 x weekly - 1 sets - 5 reps - 10 hold - Sit to Stand  - 3 x daily - 7 x weekly - 1 sets - 10 reps  ASSESSMENT:  CLINICAL IMPRESSION: Pt will continue to benefit from progressive mobility gains for ROM and gains in strength to help improve functional movement ability.  Reviewed and adapted HEP to progress and emphasis on routine use throughout the day.  Continued skilled PT services indicated at this time.  Advised use of  SPC(has to purchase) to help transition from walker to independent.   OBJECTIVE IMPAIRMENTS: decreased activity tolerance, decreased balance, decreased mobility, difficulty walking, decreased ROM, decreased strength, impaired flexibility, and pain.   ACTIVITY LIMITATIONS: lifting, bending, standing, squatting, sleeping, stairs, and transfers  PARTICIPATION LIMITATIONS: driving, community activity, and occupation  PERSONAL FACTORS: 3+ comorbidities: see PMH  are also affecting patient's functional outcome.   REHAB POTENTIAL: Good  CLINICAL DECISION MAKING: Stable/uncomplicated  EVALUATION COMPLEXITY: Low   GOALS: Goals reviewed with patient? Yes  SHORT TERM GOALS: (target date  for Short term goals are 3 weeks 10/28/2023)   1.  Patient will demonstrate independent use of home exercise program to maintain progress from in clinic treatments.  Goal status: on going 57/2025  LONG TERM GOALS: (target dates for all long term goals are 10 weeks  12/16/2023)   1. Patient will demonstrate/report pain at worst less than or equal to 2/10 to facilitate minimal limitation in daily activity secondary to pain symptoms.  Goal status: New   2. Patient will demonstrate independent use of home exercise program to facilitate ability to maintain/progress functional gains from skilled physical therapy services.  Goal status: New   3. Patient will demonstrate Patient specific functional scale avg > or = 6.75 to indicate reduced disability due to condition.   Goal status: New   4.  Patient will demonstrate left LE MMT >/= 4/5 throughout to faciltiate usual transfers, stairs, squatting at [facility] for daily life.   Goal status: New   5.  Patient will demonstrate left knee ROM of 2-115 degrees for improved functional mobility.  Goal status: New   6.  Pt will be able to navigate 1 flight of stairs with reciprocal gait pattern with single hand rail.  Goal status: New      PLAN:  PT FREQUENCY:  1-2x/week  PT DURATION: 10 weeks  PLANNED INTERVENTIONS: Can include 69629- PT Re-evaluation, 97110-Therapeutic exercises, 97530- Therapeutic activity, 97112- Neuromuscular re-education, [phone]- Self Care, 97140- Manual therapy, [phone]- Gait training, [phone]- Orthotic Fit/training, [phone]- Canalith repositioning, [id number]- Aquatic Therapy, [phone]- Electrical stimulation (unattended), [phone]- Electrical stimulation (manual), [id number] Physical performance testing, 97016- Vasopneumatic device, [id number]- Ultrasound, [id number]- Traction (mechanical), [id number]- Ionotophoresis 4mg /ml Dexamethasone , Patient/Family education, Balance training, Stair training, Taping, Dry Needling, Joint mobilization, Joint manipulation, Spinal manipulation, Spinal mobilization, Scar mobilization, Vestibular training, Visual/preceptual remediation/compensation, DME instructions, Cryotherapy, and Moist heat.  All performed as medically necessary.  All included unless contraindicated  PLAN FOR NEXT SESSION: SPC use?  Progressive strengthening and mobility gains.    Bonna Bustard, PT, DPT, OCS, ATC 10/22/23  12:05 PM

## 2023-10-23 ENCOUNTER — Encounter: Admitting: Physician Assistant

## 2023-10-27 ENCOUNTER — Other Ambulatory Visit (HOSPITAL_COMMUNITY): Payer: Self-pay

## 2023-10-27 ENCOUNTER — Encounter: Admitting: Physical Therapy

## 2023-10-29 ENCOUNTER — Encounter: Admitting: Physical Therapy

## 2023-11-01 ENCOUNTER — Other Ambulatory Visit (HOSPITAL_COMMUNITY): Payer: Self-pay

## 2023-11-03 ENCOUNTER — Other Ambulatory Visit (HOSPITAL_COMMUNITY): Payer: Self-pay

## 2023-11-03 ENCOUNTER — Encounter: Admitting: Physical Therapy

## 2023-11-05 ENCOUNTER — Encounter: Admitting: Physical Therapy

## 2023-11-12 ENCOUNTER — Encounter: Admitting: Rehabilitative and Restorative Service Providers"

## 2023-11-12 ENCOUNTER — Other Ambulatory Visit (HOSPITAL_COMMUNITY): Payer: Self-pay

## 2023-11-14 ENCOUNTER — Encounter: Admitting: Physical Therapy

## 2023-11-18 ENCOUNTER — Encounter: Admitting: Rehabilitative and Restorative Service Providers"

## 2023-11-20 ENCOUNTER — Other Ambulatory Visit: Payer: Self-pay

## 2023-11-20 ENCOUNTER — Other Ambulatory Visit (HOSPITAL_COMMUNITY): Payer: Self-pay

## 2023-11-20 ENCOUNTER — Encounter: Admitting: Rehabilitative and Restorative Service Providers"

## 2023-11-25 ENCOUNTER — Encounter: Admitting: Rehabilitative and Restorative Service Providers"

## 2023-11-27 ENCOUNTER — Encounter: Admitting: Rehabilitative and Restorative Service Providers"

## 2023-12-02 ENCOUNTER — Encounter: Admitting: Rehabilitative and Restorative Service Providers"

## 2023-12-03 ENCOUNTER — Other Ambulatory Visit (HOSPITAL_COMMUNITY): Payer: Self-pay

## 2023-12-03 ENCOUNTER — Other Ambulatory Visit: Payer: Self-pay

## 2023-12-03 ENCOUNTER — Ambulatory Visit (INDEPENDENT_AMBULATORY_CARE_PROVIDER_SITE_OTHER): Admitting: Physician Assistant

## 2023-12-03 DIAGNOSIS — Z96652 Presence of left artificial knee joint: Secondary | ICD-10-CM

## 2023-12-03 MED ORDER — HYDROCODONE-ACETAMINOPHEN 5-325 MG PO TABS
1.0000 | ORAL_TABLET | Freq: Two times a day (BID) | ORAL | 0 refills | Status: DC | PRN
  Filled 2023-12-03: qty 30, 15d supply, fill #0

## 2023-12-03 NOTE — Progress Notes (Signed)
 Post-Op Visit Note   Patient: Dale Bishop           Date of Birth: April 22, 1961           MRN: [id number] Visit Date: 12/03/2023 PCP: Genia Kettering, MD   Assessment & Plan:  Chief Complaint:  Chief Complaint  Patient presents with   Left Knee - Pain   Visit Diagnoses:  1. Status post total left knee replacement     Plan: Patient is a pleasant 63 year old gentleman who comes in today 12 weeks status post left total knee replacement 09/12/2023.  He has been doing okay.  Minimal pain to the left knee but more so to the back with a history of sciatica.  He has returned to work and is on his feet all day.  He has noticed some swelling to the left knee.  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  He has been working on a home exercise program.  Examination of his left knee reveals range of motion from 0 to 95 degrees.  He does have moderate effusion.  He is stable to valgus varus stress.  He is neurovascularly intact distally.  At this point, really reinforced the need to attend at least the formal physical therapy session once every week or every other week.  He will continue to push things at home as well.  Have sent in Norco to take for PT if needed.  He will follow-up with Dr. Christiane Cowing in 6 weeks for recheck of his range of motion.  Call with concerns or questions.  Follow-Up Instructions: Return in about 6 weeks (around 01/14/2024) for to check ROM.   Orders:  No orders of the defined types were placed in this encounter.  Meds ordered this encounter  Medications   HYDROcodone-acetaminophen  (NORCO/VICODIN) 5-325 MG tablet    Sig: Take 1 tablet by mouth 2 (two) times daily as needed for moderate pain (pain score 4-6).    Dispense:  30 tablet    Refill:  0    Imaging: No new imaging  PMFS History: Patient Active Problem List   Diagnosis Date Noted   Status post total left knee replacement  09/12/2023   Low serum vitamin B12 07/02/2023   Tremor, unspecified 02/10/2022   Cervical radiculopathy 01/24/2020   Sebaceous cyst 01/24/2020   Sciatica 01/24/2020   Erectile dysfunction 08/18/2019   Gross hematuria 06/08/2019   Need for pneumococcal vaccination 06/08/2019   Need for Tdap vaccination 06/08/2019   Acute prostatitis 06/08/2019   Primary osteoarthritis of left knee 08/05/2016   Type II diabetes mellitus with manifestations (HCC) 08/05/2016   Vitamin D  deficiency 08/05/2016   CTS (carpal tunnel syndrome) 01/08/2015   Well adult exam 08/01/2014   Allergic rhinitis 04/21/2014   Hyperlipidemia with target LDL less than 100 10/22/2013   Essential hypertension 09/01/2013   Eczema 09/01/2013   Past Medical History:  Diagnosis Date   Diabetes mellitus without complication (HCC)    Hyperlipidemia    Hypertension     Family History  Problem Relation Age of Onset   Colon cancer Neg Hx     Past Surgical History:  Procedure Laterality Date   COLONOSCOPY     NO PAST SURGERIES     TOTAL KNEE ARTHROPLASTY Left 09/12/2023   Procedure: LEFT TOTAL KNEE ARTHROPLASTY;  Surgeon: Wes Hamman, MD;  Location: [facility] OR;  Service: Orthopedics;  Laterality:  Left;   Social History   Occupational History   Not on file  Tobacco Use   Smoking status: Never   Smokeless tobacco: Never  Vaping Use   Vaping status: Never Used  Substance and Sexual Activity   Alcohol use: No    Alcohol/week: 0.0 standard drinks of alcohol   Drug use: No   Sexual activity: Yes

## 2023-12-04 ENCOUNTER — Ambulatory Visit: Admitting: Rehabilitative and Restorative Service Providers"

## 2023-12-04 ENCOUNTER — Encounter: Payer: Self-pay | Admitting: Rehabilitative and Restorative Service Providers"

## 2023-12-04 DIAGNOSIS — R262 Difficulty in walking, not elsewhere classified: Secondary | ICD-10-CM

## 2023-12-04 DIAGNOSIS — M6281 Muscle weakness (generalized): Secondary | ICD-10-CM

## 2023-12-04 DIAGNOSIS — M25562 Pain in left knee: Secondary | ICD-10-CM

## 2023-12-04 DIAGNOSIS — R600 Localized edema: Secondary | ICD-10-CM | POA: Diagnosis not present

## 2023-12-04 NOTE — Therapy (Addendum)
 OUTPATIENT PHYSICAL THERAPY TREATMENT / PROGRESS NOTE / DISCHARGE   Patient Name: Dale Bishop MRN: [id number] DOB:September 24, 1960, 63 y.o., male Today's Date: 12/04/2023  Progress Note Reporting Period 10/07/2023 to 12/04/2023  See note below for Objective Data and Assessment of Progress/Goals.      END OF SESSION:  PT End of Session - 12/04/23 1603     Visit Number 3    Number of Visits 20    Date for PT Re-Evaluation 12/16/23    Authorization Type [facility] [employer]ET[employer]    PT Start Time 1556    PT Stop Time 1636    PT Time Calculation (min) 40 min    Activity Tolerance Patient tolerated treatment well    Behavior During Therapy WFL for tasks assessed/performed            Past Medical History:  Diagnosis Date   Diabetes mellitus without complication (HCC)    Hyperlipidemia    Hypertension    Past Surgical History:  Procedure Laterality Date   COLONOSCOPY     NO PAST SURGERIES     TOTAL KNEE ARTHROPLASTY Left 09/12/2023   Procedure: LEFT TOTAL KNEE ARTHROPLASTY;  Surgeon: Jerri Kay HERO, MD;  Location: [facility] OR;  Service: Orthopedics;  Laterality: Left;   Patient Active Problem List   Diagnosis Date Noted   Status post total left knee replacement 09/12/2023   Low serum vitamin B12 07/02/2023   Tremor, unspecified 02/10/2022   Cervical radiculopathy 01/24/2020   Sebaceous cyst 01/24/2020   Sciatica 01/24/2020   Erectile dysfunction 08/18/2019   Gross hematuria 06/08/2019   Need for pneumococcal vaccination 06/08/2019   Need for Tdap vaccination 06/08/2019   Acute prostatitis 06/08/2019   Primary osteoarthritis of left knee 08/05/2016   Type II diabetes mellitus with manifestations (HCC) 08/05/2016   Vitamin D  deficiency 08/05/2016   CTS (carpal tunnel syndrome) 01/08/2015   Well adult exam 08/01/2014   Allergic rhinitis 04/21/2014   Hyperlipidemia with target LDL less than 100 10/22/2013   Essential hypertension 09/01/2013   Eczema 09/01/2013    PCP: Garald Karlynn GAILS, MD   REFERRING PROVIDER: Jule Ronal CROME, PA-C   REFERRING DIAG:  Diagnosis  [phone] (ICD-10-CM) - Status post total left knee replacement    THERAPY DIAG:  Acute pain of left knee  Difficulty in walking, not elsewhere classified  Localized edema  Muscle weakness (generalized)  Rationale for Evaluation and Treatment: Rehabilitation  ONSET DATE: 09/12/23 TKA surgery left  SUBJECTIVE:   SUBJECTIVE STATEMENT: Pt indicated having difficulty getting appointments in due to work and cost of visits.  Reported working at home for exercise.    PERTINENT HISTORY: See PMH above Left TKA on 09/12/23  PAIN:  NPRS scale: at worst in last few days:  5/10 Pain location: left knee Pain description: achy, soreness Aggravating factors: walking Relieving factors: CPM, elevated  PRECAUTIONS: None  WEIGHT BEARING RESTRICTIONS: No  FALLS:  Has patient fallen in last 6 months? No  LIVING ENVIRONMENT: Lives with: lives with their family and lives with their spouse Lives in: House/apartment Stairs: Yes: Internal: 15 steps; on left going up and and right on 2nd stairs after  landing Has following equipment at home: Vannie - 2 wheeled  OCCUPATION: finance at [employer]  PLOF: Independent  PATIENT GOALS: get in car without lifting leg, walk correctly and be comfortable and get back to gym  Next MD visit:   OBJECTIVE:   DIAGNOSTIC FINDINGS: Left knee arthroplasty without immediate postoperative complication   PATIENT  SURVEYS:  Patient-Specific Activity Scoring Scheme  0 represents "unable to perform." 10 represents "able to perform at prior level. 0 1 2 3 4 5 6 7 8 9  10 (Date and Score)   Activity Eval  10/07/23 12/04/2023  1. walking 4   9  2. sleeping 5   8  3. Standing long periods 5 7  4.bending knee 5 7  5.    Score 4.75 7.75   Total score = sum of the activity scores/number of activities Minimum detectable change (90%CI) for average score = 2 points Minimum  detectable change (90%CI) for single activity score = 3 points  COGNITION: 10/07/23 Overall cognitive status: [facility]   EDEMA:  10/07/23 Circumferential: Rt: 42 centimeters, left 48 centimeters  LOWER EXTREMITY ROM:   ROM Right 10/07/23 supine Left 10/07/23 supine Left 10/22/2023 Left 12/04/2023   Hip flexion      Hip extension      Hip abduction      Hip adduction      Hip internal rotation      Hip external rotation      Knee flexion   93 AROM in supine heel slide  100 AROM in supine heel slide  Knee extension 0 -7 -10 AROM in seated LAQ   Ankle dorsiflexion      Ankle plantarflexion      Ankle inversion      Ankle eversion       (Blank rows = not tested)  LOWER EXTREMITY MMT:  MMT Right 10/07/23 Left 10/07/23 Sitting HHD Left 10/22/2023  Hip flexion     Hip extension     Hip abduction     Hip adduction     Hip internal rotation     Hip external rotation     Knee flexion 59.0 ppsi 21.8 ppsi   Knee extension 61.9 ppsi 35.2 ppsi 5/5 41.1, 37.7 lb  Ankle dorsiflexion     Ankle plantarflexion     Ankle inversion     Ankle eversion      (Blank rows = not tested)    FUNCTIONAL TESTS:  10/07/23: 5 time sit to stand: 23.69 c UE support  GAIT: 12/04/2023  Modified independent ambulation c SPC in Rt UE.  10/07/23 Distance walked: clinic distance Assistive device utilized: Environmental consultant - 2 wheeled Level of assistance: Modified independence Comments: antalgic gait                                                                                                                                                                        TODAY'S TREATMENT  DATE: 12/04/2023 Therex: Recumbent bike partial circles seat 10, full circles for last min.  Total time of 8 mins Seated Lt leg LAQ with end range pauses each direction with contralateral leg movement opposite 2 x 15 Review of existing HEP verbally with updated  handout provided   Neuro Re-ed Tandem stance 1 min x 1 bilateral on ground, 1 min x 1 bilateral on foam with occasional HHA   Manual Seated Lt knee flexion c distraction/IR mobilization c movement for flexion gains. Contralateral leg movement opposite.    TherActivity Leg press double leg 100 lbs x 15, Lt leg only x 15 43 lbs  Step up forward WB on Lt leg 6 inch step no UE assist x 10  Lateral step up/down WB on Lt leg 6 inch step x 10   Time spent in review of stair activity for home use  TODAY'S TREATMENT                                                                          DATE: 10/22/2023 Therex: Nustep lvl 7 8 mins UE/LE  Review of existing HEP c cues for techniques and procedures. Education on importance of routine use during day. Supine heel prop 90 seconds to tolerance Supine heel slide 5 sec hold x2 Seated Lt leg LAQ x 10 with end range pauses Seated Lt leg AAROM with Rt leg overpressure 15 sec x 5 Seated quad set 5 sec hold x 10 Lt  Seated quad set with SLR x 10 Lt     TherActivity Sit to stand to sit 18 inch chair s UE assist x 5 (cues for home use with focus on lowering slowly) Leg press double leg 62 lbs x 15, Lt leg only x 15 25 lbs      TODAY'S TREATMENT                                                                          DATE:10/07/23 Therex:    HEP instruction/performance c cues for techniques, handout provided.  Trial set performed of each for comprehension and symptom assessment.  See below for exercise list Self Care:  Ice/ elevation for swelling and pain, CPM progression, applied tennis balls to pt's RW for easier navigation with his walker and encouraged heel to toe gait pattern  PATIENT EDUCATION:  HEP  update Education details: HEP Person educated: Patient Education method: Programmer, multimedia, Demonstration, Verbal cues, and Handouts Education comprehension: verbalized understanding, returned demonstration, and verbal cues required  HOME EXERCISE  PROGRAM: Access Code: [email or web address] URL: [email or web address] Date: 10/22/2023 Prepared by: Ozell Silvan  Exercises - Supine Heel Slide (Mirrored)  - 1-2 x daily - 7 x weekly - 2 sets - 10 reps - 5 seconds hold - Seated Long Arc Quad (Mirrored)  - 3 x daily - 7 x weekly - 2 sets - 10 reps - 3 seconds hold - Seated Quad Set (Mirrored)  - 3-5 x daily - 7  x weekly - 1 sets - 10 reps - 5 hold - Seated Straight Leg Heel Taps  - 1-2 x daily - 7 x weekly - 2-3 sets - 10 reps - Seated Knee Flexion Extension AAROM with Overpressure (Mirrored)  - 2-3 x daily - 7 x weekly - 1 sets - 5 reps - 10 hold - Sit to Stand  - 3 x daily - 7 x weekly - 1 sets - 10 reps  ASSESSMENT:  CLINICAL IMPRESSION: The patient has attended 3 visits over the course of treatment cycle with multiple cancelled visits reported due to busy at work/cost.  See objective data above for updated information regarding current presentation. Pt has demonstrated gains in areas but does present with continued Lt knee pain c mobility/strength deficits that impact functional activity.  Recommend continued skilled PT services at this time.    OBJECTIVE IMPAIRMENTS: decreased activity tolerance, decreased balance, decreased mobility, difficulty walking, decreased ROM, decreased strength, impaired flexibility, and pain.   ACTIVITY LIMITATIONS: lifting, bending, standing, squatting, sleeping, stairs, and transfers  PARTICIPATION LIMITATIONS: driving, community activity, and occupation  PERSONAL FACTORS: 3+ comorbidities: see PMH are also affecting patient's functional outcome.   REHAB POTENTIAL: Good  CLINICAL DECISION MAKING: Stable/uncomplicated  EVALUATION COMPLEXITY: Low   GOALS: Goals reviewed with patient? Yes  SHORT TERM GOALS: (target date for Short term goals are 3 weeks 10/28/2023)   1.  Patient will demonstrate independent use of home exercise program to maintain progress from in clinic treatments.  Goal  status:unable to assess fully due to inactivity of visits  LONG TERM GOALS: (target dates for all long term goals are 10 weeks  12/16/2023)   1. Patient will demonstrate/report pain at worst less than or equal to 2/10 to facilitate minimal limitation in daily activity secondary to pain symptoms.  Goal status: on going 12/04/2023   2. Patient will demonstrate independent use of home exercise program to facilitate ability to maintain/progress functional gains from skilled physical therapy services.  Goal status: on going 12/04/2023   3. Patient will demonstrate Patient specific functional scale avg > or = 6.75 to indicate reduced disability due to condition.   Goal status: Met 12/04/2023   4.  Patient will demonstrate left LE MMT >/= 4/5 throughout to faciltiate usual transfers, stairs, squatting at [facility] for daily life.   Goal status: on going 12/04/2023   5.  Patient will demonstrate left knee ROM of 2-115 degrees for improved functional mobility.  Goal status: on going 12/04/2023   6.  Pt will be able to navigate 1 flight of stairs with reciprocal gait pattern with single hand rail.  Goal status: on going 12/04/2023      PLAN:  PT FREQUENCY: 1-2x/week  PT DURATION: 10 weeks  PLANNED INTERVENTIONS: Can include 02853- PT Re-evaluation, 97110-Therapeutic exercises, 97530- Therapeutic activity, 97112- Neuromuscular re-education, 97535- Self Care, 97140- Manual therapy, [phone]- Gait training, [phone]- Orthotic Fit/training, [phone]- Canalith repositioning, [id number]- Aquatic Therapy, [phone]- Electrical stimulation (unattended), [phone]- Electrical stimulation (manual), [id number] Physical performance testing, 97016- Vasopneumatic device, [id number]- Ultrasound, [id number]- Traction (mechanical), [id number]- Ionotophoresis 4mg /ml Dexamethasone , Patient/Family education, Balance training, Stair training, Taping, Dry Needling, Joint mobilization, Joint manipulation, Spinal manipulation, Spinal mobilization, Scar mobilization,  Vestibular training, Visual/preceptual remediation/compensation, DME instructions, Cryotherapy, and Moist heat.  All performed as medically necessary.  All included unless contraindicated  PLAN FOR NEXT SESSION: Strengthening, flexion mobility gains, balance improvements.    Ozell Silvan, PT, DPT, OCS, ATC 12/04/23  4:36 PM  PHYSICAL THERAPY DISCHARGE SUMMARY  Visits from Start of Care: 3  Current functional level related to goals / functional outcomes: See note   Remaining deficits: See note   Education / Equipment: HEP  Patient goals were partially met. Patient is being discharged due to not returning since the last visit. Ozell Silvan, PT, DPT, OCS, ATC 02/04/24  12:11 PM

## 2023-12-22 ENCOUNTER — Other Ambulatory Visit (HOSPITAL_COMMUNITY): Payer: Self-pay

## 2023-12-22 ENCOUNTER — Other Ambulatory Visit: Payer: Self-pay

## 2023-12-22 ENCOUNTER — Encounter: Payer: Self-pay | Admitting: Pharmacist

## 2023-12-23 ENCOUNTER — Other Ambulatory Visit (HOSPITAL_COMMUNITY): Payer: Self-pay

## 2023-12-23 ENCOUNTER — Other Ambulatory Visit: Payer: Self-pay

## 2023-12-24 ENCOUNTER — Other Ambulatory Visit: Payer: Self-pay

## 2023-12-25 ENCOUNTER — Encounter: Admitting: Rehabilitative and Restorative Service Providers"

## 2023-12-25 ENCOUNTER — Other Ambulatory Visit: Payer: Self-pay

## 2023-12-26 ENCOUNTER — Encounter: Admitting: Rehabilitative and Restorative Service Providers"

## 2023-12-31 ENCOUNTER — Encounter: Admitting: Rehabilitative and Restorative Service Providers"

## 2024-01-01 ENCOUNTER — Encounter: Admitting: Rehabilitative and Restorative Service Providers"

## 2024-01-06 ENCOUNTER — Encounter: Admitting: Rehabilitative and Restorative Service Providers"

## 2024-01-08 ENCOUNTER — Encounter: Admitting: Rehabilitative and Restorative Service Providers"

## 2024-01-12 ENCOUNTER — Other Ambulatory Visit: Payer: Self-pay | Admitting: Family

## 2024-01-12 ENCOUNTER — Other Ambulatory Visit (HOSPITAL_COMMUNITY): Payer: Self-pay

## 2024-01-13 ENCOUNTER — Other Ambulatory Visit (HOSPITAL_COMMUNITY): Payer: Self-pay

## 2024-01-13 ENCOUNTER — Encounter: Admitting: Rehabilitative and Restorative Service Providers"

## 2024-01-13 ENCOUNTER — Telehealth: Payer: Self-pay | Admitting: Rehabilitative and Restorative Service Providers"

## 2024-01-13 ENCOUNTER — Other Ambulatory Visit: Payer: Self-pay

## 2024-01-13 NOTE — Therapy (Incomplete)
 OUTPATIENT PHYSICAL THERAPY TREATMENT    Patient Name: Dale Bishop MRN: [id number] DOB:1960/12/03, 63 y.o., male Today's Date: 01/13/2024  Progress Note Reporting Period 10/07/2023 to 12/04/2023  See note below for Objective Data and Assessment of Progress/Goals.      END OF SESSION:      Past Medical History:  Diagnosis Date   Diabetes mellitus without complication (HCC)    Hyperlipidemia    Hypertension    Past Surgical History:  Procedure Laterality Date   COLONOSCOPY     NO PAST SURGERIES     TOTAL KNEE ARTHROPLASTY Left 09/12/2023   Procedure: LEFT TOTAL KNEE ARTHROPLASTY;  Surgeon: Jerri Kay HERO, MD;  Location: [facility] OR;  Service: Orthopedics;  Laterality: Left;   Patient Active Problem List   Diagnosis Date Noted   Status post total left knee replacement 09/12/2023   Low serum vitamin B12 07/02/2023   Tremor, unspecified 02/10/2022   Cervical radiculopathy 01/24/2020   Sebaceous cyst 01/24/2020   Sciatica 01/24/2020   Erectile dysfunction 08/18/2019   Gross hematuria 06/08/2019   Need for pneumococcal vaccination 06/08/2019   Need for Tdap vaccination 06/08/2019   Acute prostatitis 06/08/2019   Primary osteoarthritis of left knee 08/05/2016   Type II diabetes mellitus with manifestations (HCC) 08/05/2016   Vitamin D  deficiency 08/05/2016   CTS (carpal tunnel syndrome) 01/08/2015   Well adult exam 08/01/2014   Allergic rhinitis 04/21/2014   Hyperlipidemia with target LDL less than 100 10/22/2013   Essential hypertension 09/01/2013   Eczema 09/01/2013    PCP: Plotnikov, Karlynn GAILS, MD   REFERRING PROVIDER: Jule Ronal CROME, PA-C   REFERRING DIAG:  Diagnosis  [phone] (ICD-10-CM) - Status post total left knee replacement    THERAPY DIAG:  No diagnosis found.  Rationale for Evaluation and Treatment: Rehabilitation  ONSET DATE: 09/12/23 TKA surgery left  SUBJECTIVE:   SUBJECTIVE STATEMENT: Pt indicated having difficulty getting appointments in  due to work and cost of visits.  Reported working at home for exercise.    PERTINENT HISTORY: See PMH above Left TKA on 09/12/23  PAIN:  NPRS scale: at worst in last few days:  5/10 Pain location: left knee Pain description: achy, soreness Aggravating factors: walking Relieving factors: CPM, elevated  PRECAUTIONS: None  WEIGHT BEARING RESTRICTIONS: No  FALLS:  Has patient fallen in last 6 months? No  LIVING ENVIRONMENT: Lives with: lives with their family and lives with their spouse Lives in: House/apartment Stairs: Yes: Internal: 15 steps; on left going up and and right on 2nd stairs after  landing Has following equipment at home: Vannie - 2 wheeled  OCCUPATION: finance at [employer]  PLOF: Independent  PATIENT GOALS: get in car without lifting leg, walk correctly and be comfortable and get back to gym  Next MD visit:   OBJECTIVE:   DIAGNOSTIC FINDINGS: Left knee arthroplasty without immediate postoperative complication   PATIENT SURVEYS:  Patient-Specific Activity Scoring Scheme  0 represents "unable to perform." 10 represents "able to perform at prior level. 0 1 2 3 4 5 6 7 8 9  10 (Date and Score)   Activity Eval  10/07/23 12/04/2023  1. walking 4   9  2. sleeping 5   8  3. Standing long periods 5 7  4.bending knee 5 7  5.    Score 4.75 7.75   Total score = sum of the activity scores/number of activities Minimum detectable change (90%CI) for average score = 2 points Minimum detectable change (90%CI) for single activity score =  3 points  COGNITION: 10/07/23 Overall cognitive status: [facility]   EDEMA:  10/07/23 Circumferential: Rt: 42 centimeters, left 48 centimeters  LOWER EXTREMITY ROM:   ROM Right 10/07/23 supine Left 10/07/23 supine Left 10/22/2023 Left 12/04/2023   Hip flexion      Hip extension      Hip abduction      Hip adduction      Hip internal rotation      Hip external rotation      Knee flexion   93 AROM in supine heel slide  100 AROM  in supine heel slide  Knee extension 0 -7 -10 AROM in seated LAQ   Ankle dorsiflexion      Ankle plantarflexion      Ankle inversion      Ankle eversion       (Blank rows = not tested)  LOWER EXTREMITY MMT:  MMT Right 10/07/23 Left 10/07/23 Sitting HHD Left 10/22/2023  Hip flexion     Hip extension     Hip abduction     Hip adduction     Hip internal rotation     Hip external rotation     Knee flexion 59.0 ppsi 21.8 ppsi   Knee extension 61.9 ppsi 35.2 ppsi 5/5 41.1, 37.7 lb  Ankle dorsiflexion     Ankle plantarflexion     Ankle inversion     Ankle eversion      (Blank rows = not tested)    FUNCTIONAL TESTS:  10/07/23: 5 time sit to stand: 23.69 c UE support  GAIT: 12/04/2023  Modified independent ambulation c SPC in Rt UE.  10/07/23 Distance walked: clinic distance Assistive device utilized: Environmental consultant - 2 wheeled Level of assistance: Modified independence Comments: antalgic gait                                                                                                                                                                        TODAY'S TREATMENT                                                                          DATE: 12/14/2023 Therex: R  TREATMENT  DATE: 12/04/2023 Therex: Recumbent bike partial circles seat 10, full circles for last min.  Total time of 8 mins Seated Lt leg LAQ with end range pauses each direction with contralateral leg movement opposite 2 x 15 Review of existing HEP verbally with updated handout provided   Neuro Re-ed Tandem stance 1 min x 1 bilateral on ground, 1 min x 1 bilateral on foam with occasional HHA   Manual Seated Lt knee flexion c distraction/IR mobilization c movement for flexion gains. Contralateral leg movement opposite.    TherActivity Leg press double leg 100 lbs x 15, Lt leg only x 15 43 lbs  Step up forward WB on Lt leg 6 inch step no  UE assist x 10  Lateral step up/down WB on Lt leg 6 inch step x 10   Time spent in review of stair activity for home use  TODAY'S TREATMENT                                                                          DATE: 10/22/2023 Therex: Nustep lvl 7 8 mins UE/LE  Review of existing HEP c cues for techniques and procedures. Education on importance of routine use during day. Supine heel prop 90 seconds to tolerance Supine heel slide 5 sec hold x2 Seated Lt leg LAQ x 10 with end range pauses Seated Lt leg AAROM with Rt leg overpressure 15 sec x 5 Seated quad set 5 sec hold x 10 Lt  Seated quad set with SLR x 10 Lt     TherActivity Sit to stand to sit 18 inch chair s UE assist x 5 (cues for home use with focus on lowering slowly) Leg press double leg 62 lbs x 15, Lt leg only x 15 25 lbs      TODAY'S TREATMENT                                                                          DATE:10/07/23 Therex:    HEP instruction/performance c cues for techniques, handout provided.  Trial set performed of each for comprehension and symptom assessment.  See below for exercise list Self Care:  Ice/ elevation for swelling and pain, CPM progression, applied tennis balls to pt's RW for easier navigation with his walker and encouraged heel to toe gait pattern  PATIENT EDUCATION:  HEP  update Education details: HEP Person educated: Patient Education method: Programmer, multimedia, Demonstration, Verbal cues, and Handouts Education comprehension: verbalized understanding, returned demonstration, and verbal cues required  HOME EXERCISE PROGRAM: Access Code: [email or web address] URL: [email or web address] Date: 10/22/2023 Prepared by: Ozell Silvan  Exercises - Supine Heel Slide (Mirrored)  - 1-2 x daily - 7 x weekly - 2 sets - 10 reps - 5 seconds hold - Seated Long Arc Quad (Mirrored)  - 3 x daily - 7 x weekly - 2 sets - 10 reps - 3 seconds hold - Seated Quad Set (Mirrored)  - 3-5 x daily - 7 x  weekly  - 1 sets - 10 reps - 5 hold - Seated Straight Leg Heel Taps  - 1-2 x daily - 7 x weekly - 2-3 sets - 10 reps - Seated Knee Flexion Extension AAROM with Overpressure (Mirrored)  - 2-3 x daily - 7 x weekly - 1 sets - 5 reps - 10 hold - Sit to Stand  - 3 x daily - 7 x weekly - 1 sets - 10 reps  ASSESSMENT:  CLINICAL IMPRESSION: The patient has attended 3 visits over the course of treatment cycle with multiple cancelled visits reported due to busy at work/cost.  See objective data above for updated information regarding current presentation. Pt has demonstrated gains in areas but does present with continued Lt knee pain c mobility/strength deficits that impact functional activity.  Recommend continued skilled PT services at this time.    OBJECTIVE IMPAIRMENTS: decreased activity tolerance, decreased balance, decreased mobility, difficulty walking, decreased ROM, decreased strength, impaired flexibility, and pain.   ACTIVITY LIMITATIONS: lifting, bending, standing, squatting, sleeping, stairs, and transfers  PARTICIPATION LIMITATIONS: driving, community activity, and occupation  PERSONAL FACTORS: 3+ comorbidities: see PMH are also affecting patient's functional outcome.   REHAB POTENTIAL: Good  CLINICAL DECISION MAKING: Stable/uncomplicated  EVALUATION COMPLEXITY: Low   GOALS: Goals reviewed with patient? Yes  SHORT TERM GOALS: (target date for Short term goals are 3 weeks 10/28/2023)   1.  Patient will demonstrate independent use of home exercise program to maintain progress from in clinic treatments.  Goal status:unable to assess fully due to inactivity of visits  LONG TERM GOALS: (target dates for all long term goals are 10 weeks  12/16/2023)   1. Patient will demonstrate/report pain at worst less than or equal to 2/10 to facilitate minimal limitation in daily activity secondary to pain symptoms.  Goal status: on going 12/04/2023   2. Patient will demonstrate independent use of  home exercise program to facilitate ability to maintain/progress functional gains from skilled physical therapy services.  Goal status: on going 12/04/2023   3. Patient will demonstrate Patient specific functional scale avg > or = 6.75 to indicate reduced disability due to condition.   Goal status: Met 12/04/2023   4.  Patient will demonstrate left LE MMT >/= 4/5 throughout to faciltiate usual transfers, stairs, squatting at [facility] for daily life.   Goal status: on going 12/04/2023   5.  Patient will demonstrate left knee ROM of 2-115 degrees for improved functional mobility.  Goal status: on going 12/04/2023   6.  Pt will be able to navigate 1 flight of stairs with reciprocal gait pattern with single hand rail.  Goal status: on going 12/04/2023      PLAN:  PT FREQUENCY: 1-2x/week  PT DURATION: 10 weeks  PLANNED INTERVENTIONS: Can include 02853- PT Re-evaluation, 97110-Therapeutic exercises, 97530- Therapeutic activity, 97112- Neuromuscular re-education, 97535- Self Care, 97140- Manual therapy, [phone]- Gait training, [phone]- Orthotic Fit/training, [phone]- Canalith repositioning, [id number]- Aquatic Therapy, [phone]- Electrical stimulation (unattended), [phone]- Electrical stimulation (manual), [id number] Physical performance testing, 97016- Vasopneumatic device, [id number]- Ultrasound, [id number]- Traction (mechanical), [id number]- Ionotophoresis 4mg /ml Dexamethasone , Patient/Family education, Balance training, Stair training, Taping, Dry Needling, Joint mobilization, Joint manipulation, Spinal manipulation, Spinal mobilization, Scar mobilization, Vestibular training, Visual/preceptual remediation/compensation, DME instructions, Cryotherapy, and Moist heat.  All performed as medically necessary.  All included unless contraindicated  PLAN FOR NEXT SESSION: Strengthening, flexion mobility gains, balance improvements.    Ozell Silvan, PT, DPT, OCS, ATC 01/13/24  4:01 PM

## 2024-01-13 NOTE — Telephone Encounter (Signed)
 Called patient after 15 mins no show for appointment today.  Left message and reminder of next MD visit.  No scheduled PT visits at this time.   Ozell Silvan, PT, DPT, OCS, ATC 01/13/24  4:22 PM

## 2024-01-14 ENCOUNTER — Encounter: Payer: Self-pay | Admitting: Orthopaedic Surgery

## 2024-01-14 ENCOUNTER — Ambulatory Visit (INDEPENDENT_AMBULATORY_CARE_PROVIDER_SITE_OTHER): Admitting: Orthopaedic Surgery

## 2024-01-14 VITALS — Wt 239.0 lb

## 2024-01-14 DIAGNOSIS — Z96652 Presence of left artificial knee joint: Secondary | ICD-10-CM

## 2024-01-14 NOTE — Progress Notes (Signed)
 Post-Op Visit Note   Patient: Dale Bishop           Date of Birth: 12-15-1960           MRN: [id number] Visit Date: 01/14/2024 PCP: Garald Karlynn GAILS, MD   Assessment & Plan:  Chief Complaint:  Chief Complaint  Patient presents with   Left Knee - Follow-up    Left TKA 09/12/2023   Visit Diagnoses:  1. Status post total left knee replacement     Plan: History of Present Illness Dale Bishop is a 63 year old male who presents for follow-up after knee replacement surgery. He is 4 months postop.  He underwent left knee replacement surgery on September 12, 2023, and returned to work on Nov 03, 2023, earlier than planned. He has not attended physical therapy sessions due to time and financial constraints but performs exercises at home.  He experiences occasional sharp knee pain and limited range of motion, particularly in bending. Morning stiffness causes limping until he adjusts. Stretching the knee results in a pulling sensation.  He has stopped using a cane and maintained a weight loss of 25 pounds since the surgery by eliminating [facility] and [employer] from his diet.  Physical Exam MUSCULOSKELETAL: Knee flexion past 95 degrees, normal extension, stable collaterals.  Assessment and Plan 4 months status post right total knee arthroplasty Four months post-surgery with intermittent pain and stiffness. Good extension and stability. Expected gradual improvement over the first year. - Schedule follow-up in two months for six-month post-operative evaluation. - radiographs at next visit.  Follow-Up Instructions: Return in about 2 months (around 03/16/2024).   Orders:  No orders of the defined types were placed in this encounter.  No orders of the defined types were placed in this encounter.   Imaging: No results found.  PMFS History: Patient Active Problem List   Diagnosis Date Noted   Status post total left knee replacement 09/12/2023   Low serum vitamin B12  07/02/2023   Tremor, unspecified 02/10/2022   Cervical radiculopathy 01/24/2020   Sebaceous cyst 01/24/2020   Sciatica 01/24/2020   Erectile dysfunction 08/18/2019   Gross hematuria 06/08/2019   Need for pneumococcal vaccination 06/08/2019   Need for Tdap vaccination 06/08/2019   Acute prostatitis 06/08/2019   Primary osteoarthritis of left knee 08/05/2016   Type II diabetes mellitus with manifestations (HCC) 08/05/2016   Vitamin D  deficiency 08/05/2016   CTS (carpal tunnel syndrome) 01/08/2015   Well adult exam 08/01/2014   Allergic rhinitis 04/21/2014   Hyperlipidemia with target LDL less than 100 10/22/2013   Essential hypertension 09/01/2013   Eczema 09/01/2013   Past Medical History:  Diagnosis Date   Diabetes mellitus without complication (HCC)    Hyperlipidemia    Hypertension     Family History  Problem Relation Age of Onset   Colon cancer Neg Hx     Past Surgical History:  Procedure Laterality Date   COLONOSCOPY     NO PAST SURGERIES     TOTAL KNEE ARTHROPLASTY Left 09/12/2023   Procedure: LEFT TOTAL KNEE ARTHROPLASTY;  Surgeon: Jerri Kay HERO, MD;  Location: [facility] OR;  Service: Orthopedics;  Laterality: Left;   Social History   Occupational History   Not on file  Tobacco Use   Smoking status: Never   Smokeless tobacco: Never  Vaping Use   Vaping status: Never Used  Substance and Sexual Activity   Alcohol use: No    Alcohol/week: 0.0 standard drinks of alcohol  Drug use: No   Sexual activity: Yes

## 2024-01-16 ENCOUNTER — Other Ambulatory Visit (HOSPITAL_COMMUNITY): Payer: Self-pay

## 2024-01-20 ENCOUNTER — Other Ambulatory Visit: Payer: Self-pay | Admitting: Internal Medicine

## 2024-01-20 ENCOUNTER — Other Ambulatory Visit (HOSPITAL_COMMUNITY): Payer: Self-pay

## 2024-01-23 ENCOUNTER — Other Ambulatory Visit (HOSPITAL_COMMUNITY): Payer: Self-pay

## 2024-01-25 MED ORDER — TADALAFIL 20 MG PO TABS
ORAL_TABLET | ORAL | 2 refills | Status: DC
  Filled 2024-01-25: qty 30, 30d supply, fill #0
  Filled 2024-02-20 (×2): qty 30, 30d supply, fill #1
  Filled 2024-03-22: qty 30, 30d supply, fill #2

## 2024-01-26 ENCOUNTER — Other Ambulatory Visit: Payer: Self-pay

## 2024-01-26 ENCOUNTER — Other Ambulatory Visit (HOSPITAL_COMMUNITY): Payer: Self-pay

## 2024-02-20 ENCOUNTER — Other Ambulatory Visit: Payer: Self-pay

## 2024-02-20 ENCOUNTER — Encounter: Payer: Self-pay | Admitting: Pharmacist

## 2024-02-20 ENCOUNTER — Other Ambulatory Visit (HOSPITAL_COMMUNITY): Payer: Self-pay

## 2024-03-23 ENCOUNTER — Other Ambulatory Visit (HOSPITAL_COMMUNITY): Payer: Self-pay

## 2024-03-23 ENCOUNTER — Ambulatory Visit: Admitting: Orthopaedic Surgery

## 2024-03-24 ENCOUNTER — Encounter (HOSPITAL_COMMUNITY): Payer: Self-pay

## 2024-03-24 ENCOUNTER — Other Ambulatory Visit (HOSPITAL_COMMUNITY): Payer: Self-pay

## 2024-04-10 ENCOUNTER — Other Ambulatory Visit: Payer: Self-pay | Admitting: Internal Medicine

## 2024-04-12 ENCOUNTER — Other Ambulatory Visit: Payer: Self-pay

## 2024-04-12 ENCOUNTER — Other Ambulatory Visit (HOSPITAL_COMMUNITY): Payer: Self-pay

## 2024-04-12 MED ORDER — AMLODIPINE BESYLATE 10 MG PO TABS
10.0000 mg | ORAL_TABLET | Freq: Every day | ORAL | 3 refills | Status: AC
  Filled 2024-04-12: qty 90, 90d supply, fill #0

## 2024-04-19 ENCOUNTER — Other Ambulatory Visit (HOSPITAL_COMMUNITY): Payer: Self-pay

## 2024-04-19 ENCOUNTER — Other Ambulatory Visit: Payer: Self-pay | Admitting: Internal Medicine

## 2024-04-19 ENCOUNTER — Other Ambulatory Visit: Payer: Self-pay

## 2024-04-19 ENCOUNTER — Encounter: Payer: Self-pay | Admitting: Radiology

## 2024-04-19 MED ORDER — TADALAFIL 20 MG PO TABS
ORAL_TABLET | ORAL | 2 refills | Status: AC
  Filled 2024-04-19: qty 30, 30d supply, fill #0
  Filled 2024-05-27: qty 30, 30d supply, fill #1
  Filled 2024-07-01: qty 30, 30d supply, fill #2

## 2024-04-26 ENCOUNTER — Other Ambulatory Visit: Payer: Self-pay | Admitting: Internal Medicine

## 2024-04-26 ENCOUNTER — Other Ambulatory Visit (HOSPITAL_COMMUNITY): Payer: Self-pay

## 2024-04-27 ENCOUNTER — Other Ambulatory Visit (HOSPITAL_COMMUNITY): Payer: Self-pay

## 2024-04-27 ENCOUNTER — Other Ambulatory Visit: Payer: Self-pay

## 2024-04-27 MED ORDER — ROSUVASTATIN CALCIUM 10 MG PO TABS
10.0000 mg | ORAL_TABLET | Freq: Every day | ORAL | 3 refills | Status: AC
  Filled 2024-04-27: qty 90, 90d supply, fill #0

## 2024-04-27 MED ORDER — CARVEDILOL 12.5 MG PO TABS
12.5000 mg | ORAL_TABLET | Freq: Two times a day (BID) | ORAL | 3 refills | Status: AC
  Filled 2024-04-27: qty 180, 90d supply, fill #0

## 2024-04-27 MED ORDER — LOSARTAN POTASSIUM 50 MG PO TABS
50.0000 mg | ORAL_TABLET | Freq: Every day | ORAL | 3 refills | Status: AC
  Filled 2024-04-27: qty 90, 90d supply, fill #0

## 2024-05-28 ENCOUNTER — Other Ambulatory Visit (HOSPITAL_COMMUNITY): Payer: Self-pay

## 2024-05-28 ENCOUNTER — Other Ambulatory Visit: Payer: Self-pay

## 2024-05-31 ENCOUNTER — Other Ambulatory Visit (HOSPITAL_COMMUNITY): Payer: Self-pay

## 2024-07-01 ENCOUNTER — Other Ambulatory Visit (HOSPITAL_COMMUNITY): Payer: Self-pay

## 2024-08-04 ENCOUNTER — Encounter: Admitting: Internal Medicine
